# Patient Record
Sex: Female | Born: 1990 | Race: White | Hispanic: No | Marital: Single | Smoking: Former smoker
Health system: Southern US, Community
[De-identification: ages and names within clinical notes are randomized; demographics above are authoritative.]

## PROBLEM LIST (undated history)

## (undated) DIAGNOSIS — Q059 Spina bifida, unspecified: Secondary | ICD-10-CM

## (undated) DIAGNOSIS — Z8669 Personal history of other diseases of the nervous system and sense organs: Secondary | ICD-10-CM

## (undated) HISTORY — DX: Personal history of other diseases of the nervous system and sense organs: Z86.69

## (undated) HISTORY — PX: TYMPANOSTOMY TUBE PLACEMENT: SHX32

## (undated) HISTORY — DX: Spina bifida, unspecified: Q05.9

## (undated) HISTORY — PX: OTHER SURGICAL HISTORY: SHX169

---

## 2019-08-02 ENCOUNTER — Telehealth: Payer: Self-pay | Admitting: Family

## 2019-08-02 DIAGNOSIS — R399 Unspecified symptoms and signs involving the genitourinary system: Secondary | ICD-10-CM

## 2019-08-02 MED ORDER — CEPHALEXIN 500 MG PO CAPS: 500.0000 mg | ORAL_CAPSULE | Freq: Two times a day (BID) | ORAL | 0 refills | Status: DC

## 2019-08-02 NOTE — Progress Notes (Signed)

## 2019-09-27 ENCOUNTER — Other Ambulatory Visit: Payer: Self-pay | Admitting: Family

## 2019-09-27 NOTE — Progress Notes (Signed)
Approximately 5 minutes was spent documenting and reviewing patient's chart.   

## 2020-11-23 NOTE — L&D Delivery Note (Signed)
Obstetrical Delivery Note   Date of Delivery:   10/23/2021 Primary OB:   [facility] Gestational Age/EDD: [redacted]w[redacted]d Reason for Admission: Elective IOL Antepartum complications: Patient with spina bifida occulta. H/o PP hemorrhage  Delivered By:   Cornelia Copa. MD  Delivery Type:   vacuum, low  Delivery Details:   I was called to see patient who was 4cm an hour before and then with AROM progressed to complete. With pushing, she pushed well, but the baby had decels to the 80s with pushing that stayed down to 30-60 seconds before recovering to normal. With pushing, she pushed to +4. On SVE, she was direct OA, pelvis felt adequate and with BPD to +3 when not pushing and EFW 3000gm. I told her I felt vacuum assistance is warranted due to the decels. After d/w her re: risk and benefits, she was amenable to trial. Foley catheter was removed and kiwi applied to the flexion point in between contraction. With next contraction, the kiwi had its suction placed into the green zone and with the next contraction, she easily delivered the child. Anesthesia:    epidural Intrapartum complications: non reassuring fetal hearts with pushing GBS:    Negative Laceration:    none Episiotomy:    none Rectal exam:   deferred Placenta:    Delivered and expressed via active management. Intact: yes. To pathology: no.  Delayed Cord Clamping: yes Estimated Blood Loss:   Baby:    Liveborn female, APGARs 8/9, weight 3060gm  Cornelia Copa. MD Attending Center for [facility])

## 2021-02-27 ENCOUNTER — Ambulatory Visit (INDEPENDENT_AMBULATORY_CARE_PROVIDER_SITE_OTHER): Payer: Medicaid Other | Admitting: *Deleted

## 2021-02-27 ENCOUNTER — Other Ambulatory Visit: Payer: Self-pay

## 2021-02-27 ENCOUNTER — Encounter: Payer: Self-pay | Admitting: Family Medicine

## 2021-02-27 VITALS — BP 94/71 | HR 75 | Ht 60.0 in | Wt 98.1 lb

## 2021-02-27 DIAGNOSIS — Z3201 Encounter for pregnancy test, result positive: Secondary | ICD-10-CM | POA: Diagnosis not present

## 2021-02-27 DIAGNOSIS — Z3200 Encounter for pregnancy test, result unknown: Secondary | ICD-10-CM | POA: Diagnosis not present

## 2021-02-27 LAB — POCT PREGNANCY, URINE: Preg Test, Ur: POSITIVE — AB

## 2021-02-27 NOTE — Addendum Note (Signed)
Addended by: Gerome Apley on: 02/27/2021 01:35 PM   Modules accepted: Level of Service

## 2021-02-27 NOTE — Progress Notes (Signed)
Agree with A & P. 

## 2021-02-27 NOTE — Progress Notes (Signed)
Here today for pregnancy test. States LMP 01/20/21; is sure of that date. This gives her EDD 10/27/21. We discussed we recommend starting prenatal care with provider of her choice. She plans to go to [facility] as it is closest to her house once she gets medicaid. Today needs confirmation of pregnancy for medicaid. She is already taking prenatal vitamin gummies. She is low risk. Dr. Alysia Penna in to meet patient.  Airam Heidecker,RN

## 2021-02-27 NOTE — Patient Instructions (Signed)
Prenatal Care Providers           [facility]  [address] [phone]  [facility]   [address]  [phone]  [facility] [place]       [address] [phone]            [facility] [place]     [address] [phone]          [facility]   [address] [phone]  [facility]  [address] [phone]     [facility]  [address]   [phone]     [facility]  Phone: [phone]  [facility]  Phone: [phone]  [facility] Phone: [phone]  [facility] Phone: [phone]  [facility] Phone: [phone]  [facility] Phone: [phone]  [facility]  Phone: [phone]  

## 2021-03-18 ENCOUNTER — Telehealth: Payer: Self-pay

## 2021-03-18 NOTE — Telephone Encounter (Signed)
Pt is scheduled for a New OB appt on 04/02/21. Pt called wanting to know if it is safe to take  Amoxicillin 875 mg prescribed by her Dentist  for an abscess. Pt made aware that Amoxicillin 875 mg is safe to take in pregnancy. Understanding was voiced. Phoebie Shad l Klint Lezcano, CMA

## 2021-03-27 ENCOUNTER — Encounter: Payer: Self-pay | Admitting: *Deleted

## 2021-04-01 ENCOUNTER — Encounter: Payer: Self-pay | Admitting: Family Medicine

## 2021-04-01 DIAGNOSIS — Z3490 Encounter for supervision of normal pregnancy, unspecified, unspecified trimester: Secondary | ICD-10-CM | POA: Insufficient documentation

## 2021-04-01 NOTE — Progress Notes (Signed)
Mother has Spinocerebellum Ataxia Type DATING AND VIABILITY SONOGRAM   Linda Wilkins is a 30 y.o. year old G2P1001 with LMP Patient's last menstrual period was 01/20/2021. which would correlate to  [redacted]w[redacted]d weeks gestation.  She has regular menstrual cycles.   She is here today for a confirmatory initial sonogram.    GESTATION:[redacted]w[redacted]d SINGLETON Yes     FETAL ACTIVITY:present          Heart rate         175          The fetus is active.        ADNEXA: The ovaries are normal.   GESTATIONAL AGE AND  BIOMETRICS:  Gestational criteria: Estimated Date of Delivery: 10/27/21 by early ultrasound now at [redacted]w[redacted]d  Previous Scans:10            10 weeks  CROWN RUMP LENGTH     3.23cm         10 weeks 1d                                                                               AVERAGE EGA(BY THIS SCAN):10weeks 1d  WORKING EDD 10/27/21     TECHNICIAN COMMENTS:     A copy of this report including all images has been saved and backed up to a second source for retrieval if needed. All measures and details of the anatomical scan, placentation, fluid volume and pelvic anatomy are contained in that report.  Granville Lewis 04/02/2021 3:34 PM

## 2021-04-02 ENCOUNTER — Other Ambulatory Visit: Payer: Self-pay

## 2021-04-02 ENCOUNTER — Other Ambulatory Visit (HOSPITAL_COMMUNITY)
Admission: RE | Admit: 2021-04-02 | Discharge: 2021-04-02 | Disposition: A | Discharge status: Home / Self Care | Payer: Medicaid Other | Source: Ambulatory Visit | Attending: Family Medicine | Admitting: Family Medicine

## 2021-04-02 ENCOUNTER — Ambulatory Visit (INDEPENDENT_AMBULATORY_CARE_PROVIDER_SITE_OTHER): Payer: Medicaid Other | Admitting: Family Medicine

## 2021-04-02 ENCOUNTER — Encounter: Payer: Self-pay | Admitting: Family Medicine

## 2021-04-02 VITALS — BP 90/62 | HR 63 | Wt 108.0 lb

## 2021-04-02 DIAGNOSIS — Z3481 Encounter for supervision of other normal pregnancy, first trimester: Secondary | ICD-10-CM | POA: Diagnosis not present

## 2021-04-02 DIAGNOSIS — Z820 Family history of epilepsy and other diseases of the nervous system: Secondary | ICD-10-CM

## 2021-04-02 DIAGNOSIS — Z8759 Personal history of other complications of pregnancy, childbirth and the puerperium: Secondary | ICD-10-CM | POA: Diagnosis not present

## 2021-04-02 DIAGNOSIS — Z3A10 10 weeks gestation of pregnancy: Secondary | ICD-10-CM

## 2021-04-02 DIAGNOSIS — Z3490 Encounter for supervision of normal pregnancy, unspecified, unspecified trimester: Secondary | ICD-10-CM | POA: Insufficient documentation

## 2021-04-02 NOTE — Progress Notes (Signed)
Subjective:  Linda Wilkins is a G2P1001 [redacted]w[redacted]d being seen today for her first obstetrical visit.  Her obstetrical history is significant for second pregnancy. First pregnancy ended in SVD. did have PPH, needing cytotec and IM injection. No other problems with pregnancy. 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 Patient does not intend to breast feed. Pregnancy history fully reviewed.  Patient reports no complaints.  BP 90/62   Pulse 63   Wt 108 lb (49 kg)   LMP 01/20/2021   BMI 21.09 kg/m   HISTORY: OB History  Gravida Para Term Preterm AB Living  2 1 1     1   SAB IAB Ectopic Multiple Live Births          1    # Outcome Date GA Lbr Len/2nd Weight Sex Delivery Anes PTL Lv  2 Current           1 Term 10/15/13     VBAC   LIV    Past Medical History:  Diagnosis Date  . Hx of migraines   . Spina bifida (HCC)   . Spina bifida [facility])     Past Surgical History:  Procedure Laterality Date  . adnoids    . TYMPANOSTOMY TUBE PLACEMENT      Family History  Problem Relation Age of Onset  . Rheum arthritis Mother   . Lumbar disc disease Mother   . Hypothyroidism Mother      Exam  BP 90/62   Pulse 63   Wt 108 lb (49 kg)   LMP 01/20/2021   BMI 21.09 kg/m   Chaperone present during exam  CONSTITUTIONAL: Well-developed, well-nourished female in no acute distress.  HENT:  Normocephalic, atraumatic, External right and left ear normal. Oropharynx is clear and moist EYES: Conjunctivae and EOM are normal. Pupils are equal, round, and reactive to light. No scleral icterus.  NECK: Normal range of motion, supple, no masses.  Normal thyroid.  CARDIOVASCULAR: Normal heart rate noted, regular rhythm RESPIRATORY: Clear to auscultation bilaterally. Effort and breath sounds normal, no problems with respiration noted. BREASTS: Symmetric in size. No masses, skin changes, nipple drainage, or lymphadenopathy. ABDOMEN: Soft, normal bowel sounds, no distention noted.  No tenderness, rebound  or guarding.  PELVIC: Normal appearing external genitalia; normal appearing vaginal mucosa and cervix. No abnormal discharge noted. Normal uterine size, no other palpable masses, no uterine or adnexal tenderness. MUSCULOSKELETAL: Normal range of motion. No tenderness.  No cyanosis, clubbing, or edema.  2+ distal pulses. SKIN: Skin is warm and dry. No rash noted. Not diaphoretic. No erythema. No pallor. NEUROLOGIC: Alert and oriented to person, place, and time. Normal reflexes, muscle tone coordination. No cranial nerve deficit noted. PSYCHIATRIC: Normal mood and affect. Normal behavior. Normal judgment and thought content.    Assessment:    Pregnancy: G2P1001 Patient Active Problem List   Diagnosis Date Noted  . Family history of spinocerebellar ataxia 04/02/2021  . Supervision of normal pregnancy 04/01/2021      Plan:   1. [redacted] weeks gestation of pregnancy - 06/01/2021 MFM OB DETAIL +14 WK; Future - Culture, OB Urine - Genetic Screening  2. Encounter for supervision of normal pregnancy, antepartum, unspecified gravidity Initial labs obtained Continue prenatal vitamins Reviewed n/v relief measures and warning s/s to report Reviewed recommended weight gain based on pre-gravid BMI Encouraged well-balanced diet Genetic & carrier screening discussed: requests Panorama,  Ultrasound discussed; fetal survey: requested CCNC completed> form faxed if has or is planning to apply for medicaid The nature of  [facility] with multiple MDs and other Advanced Practice Providers was explained to patient; also emphasized that fellows, residents, and students are part of our team. - Obstetric Panel, Including HIV - Hepatitis C Antibody - AMB MFM GENETICS REFERRAL - Cytology - PAP( [place]) - Culture, OB Urine - Genetic Screening  3. Family history of spinocerebellar ataxia Refer to genetics. - AMB MFM GENETICS REFERRAL - [place] MFM OB DETAIL +14 WK; Future - Culture, OB  Urine - Genetic Screening  4. History of postpartum hemorrhage Aggressive management of third stage    Problem list reviewed and updated. 75% of 30 min visit spent on counseling and coordination of care.     Levie Heritage 04/02/2021

## 2021-04-02 NOTE — Progress Notes (Signed)
Bedside U/S shows single IUP with FHT of 175 BPM and CRL measures 3.23cm  GA [redacted]w[redacted]d which is consistent with LMP

## 2021-04-04 LAB — URINE CULTURE, OB REFLEX

## 2021-04-04 LAB — CYTOLOGY - PAP
Chlamydia: NEGATIVE
Comment: NEGATIVE
Comment: NORMAL
Diagnosis: NEGATIVE
Neisseria Gonorrhea: NEGATIVE

## 2021-04-04 LAB — CULTURE, OB URINE

## 2021-04-07 ENCOUNTER — Telehealth: Payer: Self-pay

## 2021-04-07 LAB — OBSTETRIC PANEL, INCLUDING HIV
Antibody Screen: NEGATIVE
Basophils Absolute: 0 10*3/uL (ref 0.0–0.2)
Basos: 1 %
EOS (ABSOLUTE): 0.3 10*3/uL (ref 0.0–0.4)
Eos: 4 %
HIV Screen 4th Generation wRfx: NONREACTIVE
Hematocrit: 36.7 % (ref 34.0–46.6)
Hemoglobin: 12.6 g/dL (ref 11.1–15.9)
Hepatitis B Surface Ag: NEGATIVE
Immature Grans (Abs): 0 10*3/uL (ref 0.0–0.1)
Immature Granulocytes: 0 %
Lymphocytes Absolute: 1.4 10*3/uL (ref 0.7–3.1)
Lymphs: 18 %
MCH: 31.7 pg (ref 26.6–33.0)
MCHC: 34.3 g/dL (ref 31.5–35.7)
MCV: 92 fL (ref 79–97)
Monocytes Absolute: 0.5 10*3/uL (ref 0.1–0.9)
Monocytes: 6 %
Neutrophils Absolute: 5.5 10*3/uL (ref 1.4–7.0)
Neutrophils: 71 %
Platelets: 194 10*3/uL (ref 150–450)
RBC: 3.98 x10E6/uL (ref 3.77–5.28)
RDW: 12.2 % (ref 11.7–15.4)
RPR Ser Ql: NONREACTIVE
Rh Factor: POSITIVE
Rubella Antibodies, IGG: 23.5 index (ref >0.99)
WBC: 7.8 10*3/uL (ref 3.4–10.8)

## 2021-04-07 LAB — HEPATITIS C ANTIBODY: Hep C Virus Ab: <0.1 s/co ratio (ref 0.0–0.9)

## 2021-04-07 NOTE — Telephone Encounter (Signed)
Talked to Bill at [facility] regarding missing ICD 10 code. Bill made aware that her ICD 10 code is Z34.90.  Amayra Kiedrowski l Shamyah Stantz, CMA

## 2021-04-08 ENCOUNTER — Encounter: Payer: Self-pay | Admitting: General Practice

## 2021-04-09 ENCOUNTER — Encounter: Payer: Self-pay | Admitting: General Practice

## 2021-04-12 ENCOUNTER — Inpatient Hospital Stay (HOSPITAL_COMMUNITY): Payer: Medicaid Other

## 2021-04-12 ENCOUNTER — Other Ambulatory Visit: Payer: Self-pay

## 2021-04-12 ENCOUNTER — Inpatient Hospital Stay (HOSPITAL_COMMUNITY)
Admission: EM | Admit: 2021-04-12 | Discharge: 2021-04-12 | Disposition: A | Discharge status: Home / Self Care | Payer: Medicaid Other | Attending: Obstetrics and Gynecology | Admitting: Obstetrics and Gynecology

## 2021-04-12 ENCOUNTER — Encounter (HOSPITAL_COMMUNITY): Payer: Self-pay | Admitting: *Deleted

## 2021-04-12 DIAGNOSIS — Z87891 Personal history of nicotine dependence: Secondary | ICD-10-CM | POA: Insufficient documentation

## 2021-04-12 DIAGNOSIS — O208 Other hemorrhage in early pregnancy: Secondary | ICD-10-CM | POA: Diagnosis not present

## 2021-04-12 DIAGNOSIS — Z3A12 12 weeks gestation of pregnancy: Secondary | ICD-10-CM | POA: Diagnosis not present

## 2021-04-12 DIAGNOSIS — O468X1 Other antepartum hemorrhage, first trimester: Secondary | ICD-10-CM

## 2021-04-12 DIAGNOSIS — O209 Hemorrhage in early pregnancy, unspecified: Secondary | ICD-10-CM

## 2021-04-12 DIAGNOSIS — Z3A11 11 weeks gestation of pregnancy: Secondary | ICD-10-CM

## 2021-04-12 DIAGNOSIS — Z3490 Encounter for supervision of normal pregnancy, unspecified, unspecified trimester: Secondary | ICD-10-CM

## 2021-04-12 DIAGNOSIS — O418X10 Other specified disorders of amniotic fluid and membranes, first trimester, not applicable or unspecified: Secondary | ICD-10-CM

## 2021-04-12 DIAGNOSIS — O418X90 Other specified disorders of amniotic fluid and membranes, unspecified trimester, not applicable or unspecified: Secondary | ICD-10-CM | POA: Insufficient documentation

## 2021-04-12 DIAGNOSIS — O4690 Antepartum hemorrhage, unspecified, unspecified trimester: Secondary | ICD-10-CM

## 2021-04-12 LAB — URINALYSIS, ROUTINE W REFLEX MICROSCOPIC
Bacteria, UA: NONE SEEN
Bilirubin Urine: NEGATIVE
Glucose, UA: NEGATIVE mg/dL
Ketones, ur: NEGATIVE mg/dL
Leukocytes,Ua: NEGATIVE
Nitrite: NEGATIVE
Protein, ur: NEGATIVE mg/dL
Specific Gravity, Urine: 1.015 (ref 1.005–1.030)
pH: 6 (ref 5.0–8.0)

## 2021-04-12 NOTE — Discharge Instructions (Signed)
Return to MAU:  If you have heavy bleeding that soaks through more that 2 pads per hour for an hour or more  If you bleed so much that you feel like you might pass out or you do pass out  If you have significant abdominal pain that is not improved with Tylenol 1000 mg every 6 hours as needed for pain  If you develop a fever > 100.5   

## 2021-04-12 NOTE — ED Provider Notes (Signed)
Emergency Medicine Provider OB Triage Evaluation Note  Shan Padgett is a 30 y.o. female, G2P1001, at [redacted]w[redacted]d gestation who presents to the emergency department with complaints of vaginal bleeding that began this AM. No abd pain, urinary complaints.   Review of  Systems  Positive: vaginal bleeding Negative: abd pain  Physical Exam  BP 111/71 (BP Location: Right Arm)   Pulse 88   Temp 98.5 F (36.9 C)   Resp 16   Ht 5' (1.524 m)   Wt 49.9 kg   LMP 01/20/2021   SpO2 100%   BMI 21.48 kg/m  General: Awake, no distress  HEENT: Atraumatic  Resp: Normal effort  Cardiac: Normal rate Abd: Nondistended, nontender  MSK: Moves all extremities without difficulty Neuro: Speech clear  Medical Decision Making  Pt evaluated for pregnancy concern and is stable for transfer to MAU. Pt is in agreement with plan for transfer.  10:32 AM Discussed with MAU APP, Joni Reining , who accepts patient in transfer.  Clinical Impression   1. Vaginal bleeding in pregnancy        Rosana Hoes 04/12/21 1032    Terald Sleeper, MD 04/12/21 [phone]

## 2021-04-12 NOTE — ED Triage Notes (Addendum)
Pt is [redacted] weeks pregnant.  She noted blood in her underwear today.  No clots or cramping.  Pt is worried b/c her dog jumped on her stomach last night.

## 2021-04-12 NOTE — MAU Provider Note (Signed)
History     CSN: [id number]  Arrival date and time: 04/12/21 1014   Event Date/Time   First Provider Initiated Contact with Patient 04/12/21 1206      Chief Complaint  Patient presents with  . Vaginal Bleeding    [redacted] weeks pregnant   Linda Wilkins is a 30 y.o. year old G57P1001 female at [redacted]w[redacted]d weeks gestation who presents to MAU reporting vaginal bleeding in the toilet while at home at 1045 this morning. She reports she had SI last night. She denies any pain. She also is worried that since her 70 lb dog jumped on her stomach that that could have caused this to happen. She receives her [facility] with [facility]; next appt is on 04/30/2021.   OB History    Gravida  2   Para  1   Term  1   Preterm      AB      Living  1     SAB      IAB      Ectopic      Multiple      Live Births  1           Past Medical History:  Diagnosis Date  . Hx of migraines   . Spina bifida (HCC)   . Spina bifida [facility])     Past Surgical History:  Procedure Laterality Date  . adnoids    . TYMPANOSTOMY TUBE PLACEMENT      Family History  Problem Relation Age of Onset  . Rheum arthritis Mother   . Lumbar disc disease Mother   . Hypothyroidism Mother     Social History   Tobacco Use  . Smoking status: Former Smoker    Types: Cigarettes  . Smokeless tobacco: Never Used  Vaping Use  . Vaping Use: Former  . Substances: Nicotine, Flavoring  Substance Use Topics  . Alcohol use: Not Currently  . Drug use: Not Currently    Allergies:  Allergies  Allergen Reactions  . Bactrim [Sulfamethoxazole-Trimethoprim]     Medications Prior to Admission  Medication Sig Dispense Refill Last Dose  . Prenatal MV & Min w/FA-DHA (PRENATAL GUMMIES PO) Take 2 tablets by mouth daily.   04/11/2021 at 0800    Review of Systems  Constitutional: Negative.   HENT: Negative.   Eyes: Negative.   Respiratory: Negative.   Cardiovascular: Negative.   Gastrointestinal: Negative.   Endocrine:  Negative.   Genitourinary: Positive for vaginal bleeding. Negative for pelvic pain.  Musculoskeletal: Negative.   Skin: Negative.   Allergic/Immunologic: Negative.   Neurological: Negative.   Hematological: Negative.   Psychiatric/Behavioral: Negative.    Physical Exam   Blood pressure 90/62, pulse 71, temperature 97.7 F (36.5 C), temperature source Oral, resp. rate 20, height 5' (1.524 m), weight 49.2 kg, last menstrual period 01/20/2021, SpO2 100 %.  Physical Exam Vitals and nursing note reviewed. Exam conducted with a chaperone present.  Constitutional:      Appearance: Normal appearance. She is normal weight.  Cardiovascular:     Rate and Rhythm: Normal rate.     Pulses: Normal pulses.  Pulmonary:     Effort: Pulmonary effort is normal.  Abdominal:     General: Abdomen is flat.     Palpations: Abdomen is soft.  Genitourinary:    General: Normal vulva.     Comments: Pelvic exam: External genitalia normal, SE: vaginal walls pink and well rugated, cervix is smooth, pink, no lesions, small amt of dark,  reddish brown blood in vaginal vault -- cervix visually closed, Uterus is non-tender, S=D, no CMT or friability, no adnexal tenderness.  Musculoskeletal:     Cervical back: Normal range of motion.  Skin:    General: Skin is warm and dry.  Neurological:     Mental Status: She is alert and oriented to person, place, and time.  Psychiatric:        Mood and Affect: Mood normal.        Behavior: Behavior normal.        Thought Content: Thought content normal.        Judgment: Judgment normal.    MAU Course  Procedures  MDM CCUA Speculum exam Cervical exam OB U/S <14 wks  Results for orders placed or performed during the hospital encounter of 04/12/21 (from the past 24 hour(s))  Urinalysis, Routine w reflex microscopic Urine, Clean Catch     Status: Abnormal   Collection Time: 04/12/21 11:24 AM  Result Value Ref Range   Color, Urine YELLOW YELLOW   APPearance CLEAR  CLEAR   Specific Gravity, Urine 1.015 1.005 - 1.030   pH 6.0 5.0 - 8.0   Glucose, UA NEGATIVE NEGATIVE mg/dL   Hgb urine dipstick SMALL (A) NEGATIVE   Bilirubin Urine NEGATIVE NEGATIVE   Ketones, ur NEGATIVE NEGATIVE mg/dL   Protein, ur NEGATIVE NEGATIVE mg/dL   Nitrite NEGATIVE NEGATIVE   Leukocytes,Ua NEGATIVE NEGATIVE   WBC, UA 0-5 0 - 5 WBC/hpf   Bacteria, UA NONE SEEN NONE SEEN   Squamous Epithelial / LPF 0-5 0 - 5   Mucus PRESENT     US OB Comp Less 14 Wks  Result Date: 04/12/2021 CLINICAL DATA:  Bleeding for 1 week. 30 year old female with gestational age of [redacted] weeks 5 days by last menstrual. Presents with vaginal bleeding in the setting of pregnancy. EXAM: OBSTETRIC <14 WK ULTRASOUND TECHNIQUE: Transabdominal ultrasound was performed for evaluation of the gestation as well as the maternal uterus and adnexal regions. COMPARISON:  None FINDINGS: Intrauterine gestational sac: Single Yolk sac:  Visualized Embryo:  Visualized Cardiac Activity: Visualized. Heart Rate: 162 bpm CRL: 51.2 mm   11 w 6 d                  [place] EDC: 10/26/2021 Subchorionic hemorrhage:  Small subchorionic hemorrhage Maternal uterus/adnexae: Normal appearance of the bilateral ovaries. Gestational sac in the fundus. IMPRESSION: Single viable intrauterine pregnancy, 11 weeks 6 days by crown-rump length with a fetal heart rate of 162 beats per minute. Small subchorionic hemorrhage. Electronically Signed   By: Donzetta Kohut M.D.   On: 04/12/2021 14:07    Assessment and Plan  Vaginal bleeding in pregnancy - Return to MAU:  If you have heavier bleeding that soaks through more that 2 pads per hour for an hour or more  If you bleed so much that you feel like you might pass out or you do pass out  If you have significant abdominal pain that is not improved with Tylenol 1000 mg every 6 hours as needed for pain  If you develop a fever > 100.5  Subchorionic hematoma in first trimester, single or unspecified fetus -  Information provided on [facility], threatened miscarriage & activity restrictions in pregnancy   [redacted] weeks gestation of pregnancy   - Discharge patient - Keep scheduled appt with [facility] on 04/30/2021 - Patient verbalized an understanding of the plan of care and agrees.     Raelyn Mora, CNM 04/12/2021, 12:18 PM

## 2021-04-12 NOTE — MAU Note (Signed)
Presents with c/o VB that began @ 1045 this morning.  Reports VB was noted in toilet @ home, but upon using restroom @ hospital no VB noted.  Reports last intercourse during the night.

## 2021-04-24 ENCOUNTER — Encounter: Payer: Self-pay | Admitting: General Practice

## 2021-04-30 ENCOUNTER — Ambulatory Visit (INDEPENDENT_AMBULATORY_CARE_PROVIDER_SITE_OTHER): Payer: Medicaid Other | Admitting: Family Medicine

## 2021-04-30 ENCOUNTER — Other Ambulatory Visit: Payer: Self-pay

## 2021-04-30 VITALS — BP 95/60 | HR 72 | Temp 97.8°F | Wt 115.0 lb

## 2021-04-30 DIAGNOSIS — O418X10 Other specified disorders of amniotic fluid and membranes, first trimester, not applicable or unspecified: Secondary | ICD-10-CM

## 2021-04-30 DIAGNOSIS — Z3A14 14 weeks gestation of pregnancy: Secondary | ICD-10-CM

## 2021-04-30 DIAGNOSIS — Z3490 Encounter for supervision of normal pregnancy, unspecified, unspecified trimester: Secondary | ICD-10-CM

## 2021-04-30 DIAGNOSIS — Z8759 Personal history of other complications of pregnancy, childbirth and the puerperium: Secondary | ICD-10-CM

## 2021-04-30 DIAGNOSIS — Z820 Family history of epilepsy and other diseases of the nervous system: Secondary | ICD-10-CM

## 2021-04-30 DIAGNOSIS — O468X1 Other antepartum hemorrhage, first trimester: Secondary | ICD-10-CM

## 2021-04-30 NOTE — Progress Notes (Signed)
   PRENATAL VISIT NOTE  Subjective:  Cristen Bredeson is a 30 y.o. G2P1001 at [redacted]w[redacted]d being seen today for ongoing prenatal care.  She is currently monitored for the following issues for this low-risk pregnancy and has Supervision of normal pregnancy; Family history of spinocerebellar ataxia; and History of postpartum hemorrhage on their problem list.  Patient reports no complaints. No further bleeding (was seen in MAU for subchorionic hemorrhage).  Contractions: Not present. Vag. Bleeding: None.  Movement: Absent. Denies leaking of fluid.   The following portions of the patient's history were reviewed and updated as appropriate: allergies, current medications, past family history, past medical history, past social history, past surgical history and problem list.   Objective:   Vitals:   04/30/21 1359  BP: 95/60  Pulse: 72  Temp: 97.8 F (36.6 C)  Weight: 115 lb (52.2 kg)    Fetal Status: Fetal Heart Rate (bpm): 157   Movement: Absent     General:  Alert, oriented and cooperative. Patient is in no acute distress.  Skin: Skin is warm and dry. No rash noted.   Cardiovascular: Normal heart rate noted  Respiratory: Normal respiratory effort, no problems with respiration noted  Abdomen: Soft, gravid, appropriate for gestational age.  Pain/Pressure: Absent     Pelvic: Cervical exam deferred        Extremities: Normal range of motion.  Edema: None  Mental Status: Normal mood and affect. Normal behavior. Normal judgment and thought content.   Assessment and Plan:  Pregnancy: G2P1001 at [redacted]w[redacted]d  1. [redacted] weeks gestation of pregnancy  2. Encounter for supervision of normal pregnancy, antepartum, unspecified gravidity FHT and FH normal  3. History of postpartum hemorrhage  4. Family history of spinocerebellar ataxia  5. Subchorionic hematoma in first trimester, single or unspecified fetus resolved   Preterm labor symptoms and general obstetric precautions including but not limited to  vaginal bleeding, contractions, leaking of fluid and fetal movement were reviewed in detail with the patient. Please refer to After Visit Summary for other counseling recommendations.   Return in about 4 weeks (around 05/28/2021) for OB f/u, In Office.  Future Appointments  Date Time Provider Department Center  05/30/2021  8:45 AM Levie Heritage, DO [facility] None  06/04/2021  8:00 AM [facility] NURSE [facility]  06/04/2021  8:15 AM [facility] US2 [facility]  06/04/2021  9:00 AM [facility] GENETIC COUNSELING RM [facility]    Levie Heritage, DO

## 2021-05-06 ENCOUNTER — Telehealth: Payer: Self-pay

## 2021-05-06 NOTE — Telephone Encounter (Signed)
mar/lm for patient requesting a c/b  (to reschedule Gen Couns for 06/04/2021 to a tues or thursday.

## 2021-05-14 DIAGNOSIS — U071 COVID-19: Secondary | ICD-10-CM

## 2021-05-15 MED ORDER — PAXLOVID 20 X 150 MG & 10 X 100MG PO TBPK: 1.0000 | ORAL_TABLET | ORAL | 0 refills | Status: DC

## 2021-05-30 ENCOUNTER — Other Ambulatory Visit: Payer: Self-pay

## 2021-05-30 ENCOUNTER — Ambulatory Visit (INDEPENDENT_AMBULATORY_CARE_PROVIDER_SITE_OTHER): Payer: Medicaid Other | Admitting: Family Medicine

## 2021-05-30 ENCOUNTER — Encounter: Payer: Self-pay | Admitting: Family Medicine

## 2021-05-30 VITALS — BP 92/58 | HR 83 | Wt 122.0 lb

## 2021-05-30 DIAGNOSIS — Z820 Family history of epilepsy and other diseases of the nervous system: Secondary | ICD-10-CM

## 2021-05-30 DIAGNOSIS — Z8759 Personal history of other complications of pregnancy, childbirth and the puerperium: Secondary | ICD-10-CM

## 2021-05-30 DIAGNOSIS — Z3482 Encounter for supervision of other normal pregnancy, second trimester: Secondary | ICD-10-CM

## 2021-05-30 NOTE — Progress Notes (Signed)
   PRENATAL VISIT NOTE  Subjective:  Linda Wilkins is a 30 y.o. G2P1001 at [redacted]w[redacted]d being seen today for ongoing prenatal care.  She is currently monitored for the following issues for this low-risk pregnancy and has Supervision of normal pregnancy; Family history of spinocerebellar ataxia; and History of postpartum hemorrhage on their problem list.  Patient reports no complaints.  Contractions: Not present. Vag. Bleeding: None.  Movement: Present. Denies leaking of fluid.   The following portions of the patient's history were reviewed and updated as appropriate: allergies, current medications, past family history, past medical history, past social history, past surgical history and problem list.   Objective:   Vitals:   05/30/21 0837  BP: (!) 92/58  Pulse: 83  Weight: 122 lb (55.3 kg)    Fetal Status: Fetal Heart Rate (bpm): 145   Movement: Present     General:  Alert, oriented and cooperative. Patient is in no acute distress.  Skin: Skin is warm and dry. No rash noted.   Cardiovascular: Normal heart rate noted  Respiratory: Normal respiratory effort, no problems with respiration noted  Abdomen: Soft, gravid, appropriate for gestational age.  Pain/Pressure: Absent     Pelvic: Cervical exam deferred        Extremities: Normal range of motion.  Edema: None  Mental Status: Normal mood and affect. Normal behavior. Normal judgment and thought content.   Assessment and Plan:  Pregnancy: G2P1001 at [redacted]w[redacted]d 1. Encounter for supervision of other normal pregnancy in second trimester FHT and FH normal - AFP, Serum, Open Spina Bifida  2. History of postpartum hemorrhage Active management of 3rd stage of labor  3. Family history of spinocerebellar ataxia Referred to genetics  Preterm labor symptoms and general obstetric precautions including but not limited to vaginal bleeding, contractions, leaking of fluid and fetal movement were reviewed in detail with the patient. Please refer to  After Visit Summary for other counseling recommendations.   Return in about 4 weeks (around 06/27/2021) for OB f/u.  Future Appointments  Date Time Provider Department Center  06/04/2021  8:00 AM [facility] NURSE [facility]  06/04/2021  8:15 AM [facility] US2 [facility]  06/10/2021  9:00 AM [facility] GENETIC COUNSELING RM [facility]  06/26/2021  8:35 AM Conan Bowens, MD [facility] None  07/24/2021  8:15 AM Levie Heritage, DO [facility] None    Levie Heritage, DO

## 2021-05-30 NOTE — Progress Notes (Signed)
AFP today

## 2021-06-01 LAB — AFP, SERUM, OPEN SPINA BIFIDA
AFP MoM: 0.74
AFP Value: 42.1 ng/mL
Gest. Age on Collection Date: 18.6 weeks
Maternal Age At EDD: 29 yr
OSBR Risk 1 IN: 10000
Test Results:: NEGATIVE
Weight: 122 [lb_av]

## 2021-06-04 ENCOUNTER — Other Ambulatory Visit: Payer: Self-pay

## 2021-06-04 ENCOUNTER — Ambulatory Visit: Payer: Medicaid Other | Admitting: *Deleted

## 2021-06-04 ENCOUNTER — Encounter: Payer: Self-pay | Admitting: *Deleted

## 2021-06-04 ENCOUNTER — Ambulatory Visit: Payer: Medicaid Other | Attending: Family Medicine

## 2021-06-04 VITALS — BP 99/53 | HR 69

## 2021-06-04 DIAGNOSIS — Z363 Encounter for antenatal screening for malformations: Secondary | ICD-10-CM | POA: Diagnosis not present

## 2021-06-04 DIAGNOSIS — F17210 Nicotine dependence, cigarettes, uncomplicated: Secondary | ICD-10-CM

## 2021-06-04 DIAGNOSIS — O99332 Smoking (tobacco) complicating pregnancy, second trimester: Secondary | ICD-10-CM

## 2021-06-04 DIAGNOSIS — U071 COVID-19: Secondary | ICD-10-CM

## 2021-06-04 DIAGNOSIS — Z820 Family history of epilepsy and other diseases of the nervous system: Secondary | ICD-10-CM

## 2021-06-04 DIAGNOSIS — Z87798 Personal history of other (corrected) congenital malformations: Secondary | ICD-10-CM | POA: Insufficient documentation

## 2021-06-04 DIAGNOSIS — Z3A10 10 weeks gestation of pregnancy: Secondary | ICD-10-CM

## 2021-06-04 DIAGNOSIS — Z3482 Encounter for supervision of other normal pregnancy, second trimester: Secondary | ICD-10-CM

## 2021-06-04 DIAGNOSIS — Z3A19 19 weeks gestation of pregnancy: Secondary | ICD-10-CM

## 2021-06-10 ENCOUNTER — Ambulatory Visit: Payer: Medicaid Other | Attending: Family Medicine

## 2021-06-25 ENCOUNTER — Telehealth: Payer: Self-pay | Admitting: Obstetrics and Gynecology

## 2021-06-25 NOTE — Telephone Encounter (Signed)
Linda Wilkins did not show for genetic counseling visit this morning (and again 2 weeks ago) which was scheduled due to a family history of Spinocerebellar Ataxia type 1 in her mother and several maternal relatives.  She declines to have genetic counseling at this time and is aware that presymptomatic testing is available and that prenatal diagnosis for adult onset disorders is not commonly encouraged.    She declines genetic counseling at this time and will contact us in the future if she wants further information. We can be reached at [phone].  Cherly Anderson, MS, CGC

## 2021-06-26 ENCOUNTER — Ambulatory Visit (INDEPENDENT_AMBULATORY_CARE_PROVIDER_SITE_OTHER): Payer: Medicaid Other | Admitting: Obstetrics and Gynecology

## 2021-06-26 ENCOUNTER — Encounter: Payer: Self-pay | Admitting: Obstetrics and Gynecology

## 2021-06-26 ENCOUNTER — Other Ambulatory Visit: Payer: Self-pay

## 2021-06-26 VITALS — BP 89/65 | HR 97 | Wt 126.0 lb

## 2021-06-26 DIAGNOSIS — Z3A22 22 weeks gestation of pregnancy: Secondary | ICD-10-CM

## 2021-06-26 DIAGNOSIS — Z3482 Encounter for supervision of other normal pregnancy, second trimester: Secondary | ICD-10-CM

## 2021-06-26 NOTE — Progress Notes (Signed)
   PRENATAL VISIT NOTE  Subjective:  Linda Wilkins is a 30 y.o. G2P1001 at [redacted]w[redacted]d being seen today for ongoing prenatal care.  She is currently monitored for the following issues for this low-risk pregnancy and has Supervision of normal pregnancy; Family history of spinocerebellar ataxia; and History of postpartum hemorrhage on their problem list.  Patient reports no complaints.  Contractions: Not present. Vag. Bleeding: None.  Movement: Present. Denies leaking of fluid.   The following portions of the patient's history were reviewed and updated as appropriate: allergies, current medications, past family history, past medical history, past social history, past surgical history and problem list.   Objective:   Vitals:   06/26/21 0842  BP: (!) 89/65  Pulse: 97  Weight: 126 lb 0.6 oz (57.2 kg)    Fetal Status: Fetal Heart Rate (bpm): 153   Movement: Present     General:  Alert, oriented and cooperative. Patient is in no acute distress.  Skin: Skin is warm and dry. No rash noted.   Cardiovascular: Normal heart rate noted  Respiratory: Normal respiratory effort, no problems with respiration noted  Abdomen: Soft, gravid, appropriate for gestational age.  Pain/Pressure: Absent     Pelvic: Cervical exam deferred        Extremities: Normal range of motion.  Edema: Trace  Mental Status: Normal mood and affect. Normal behavior. Normal judgment and thought content.   Assessment and Plan:  Pregnancy: G2P1001 at [redacted]w[redacted]d 1. [redacted] weeks gestation of pregnancy  2. Encounter for supervision of other normal pregnancy in second trimester Interested in water birth, will make next appt with CNM  Preterm labor symptoms and general obstetric precautions including but not limited to vaginal bleeding, contractions, leaking of fluid and fetal movement were reviewed in detail with the patient. Please refer to After Visit Summary for other counseling recommendations.   Return in about 4 weeks (around  07/24/2021) for low OB, in person, 2 hr GTT, 3rd trim labs.  Future Appointments  Date Time Provider Department Center  07/24/2021  8:15 AM Levie Heritage, DO [facility] None    Conan Bowens, MD

## 2021-07-04 IMAGING — US US OB COMP LESS 14 WK
1 series · 15 of 27 positions shown · non-contrast
Comparison: None

CLINICAL DATA: Bleeding for 1 week. 29-year-old female with
gestational age of 11 weeks 5 days by last menstrual. Presents with
vaginal bleeding in the setting of pregnancy.

EXAM:
OBSTETRIC <14 WK ULTRASOUND
TECHNIQUE: Transabdominal ultrasound was performed for evaluation of the
gestation as well as the maternal uterus and adnexal regions.

[Series 1: us ob comp less 14 wk · 15 of 27 slices shown]
[im 1/27]
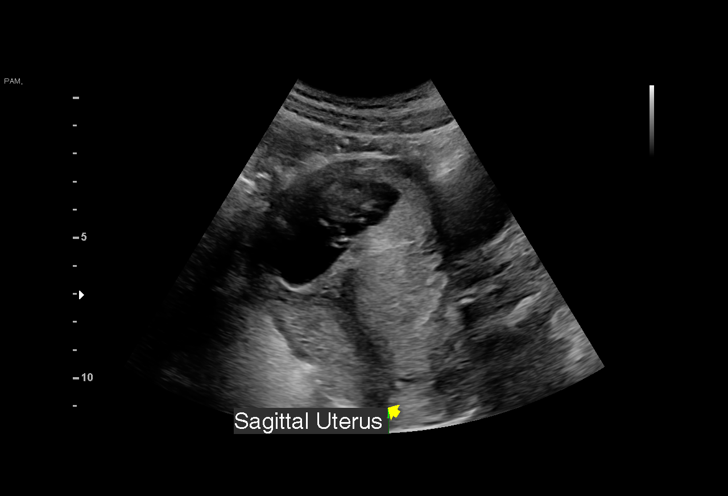
[im 3/27]
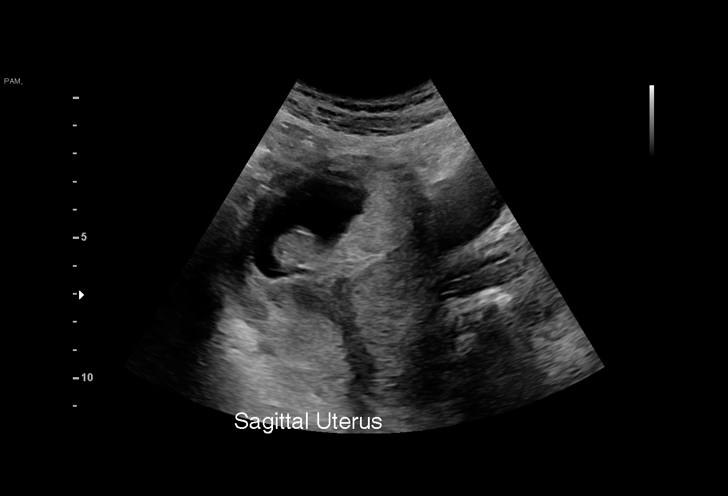
[im 5/27]
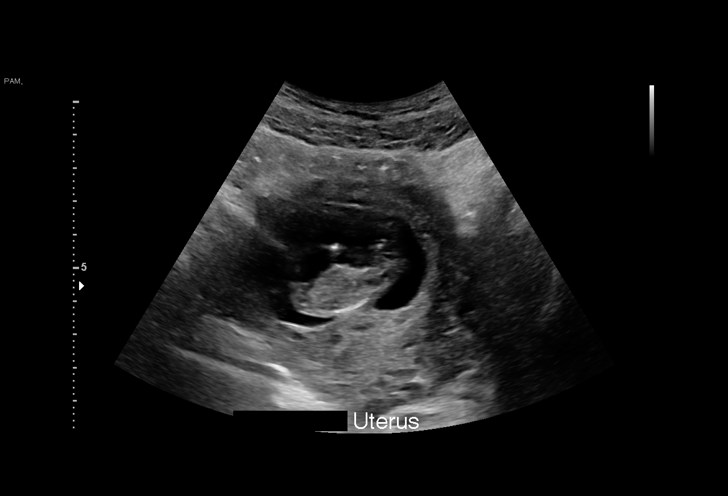
[im 7/27]
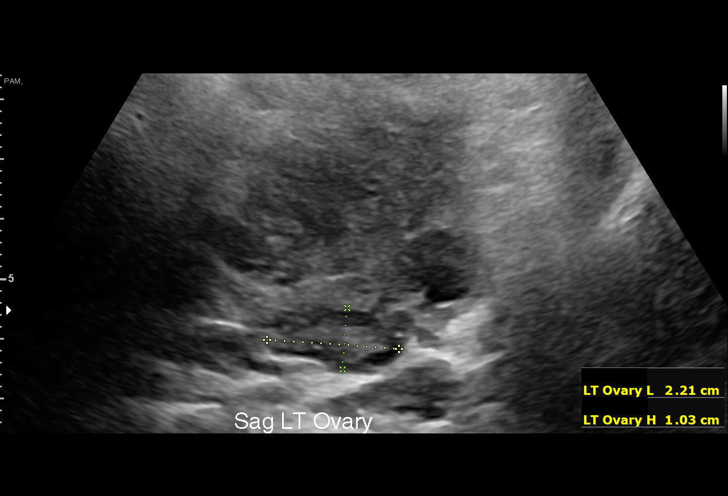
[im 9/27]
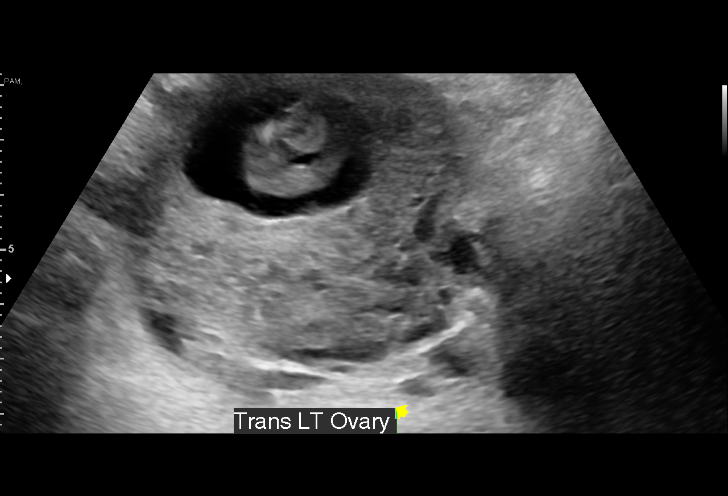
[im 10/27]
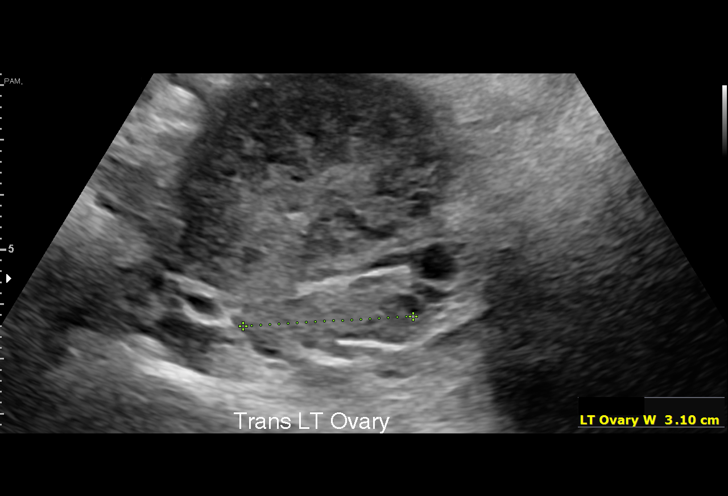
[im 12/27]
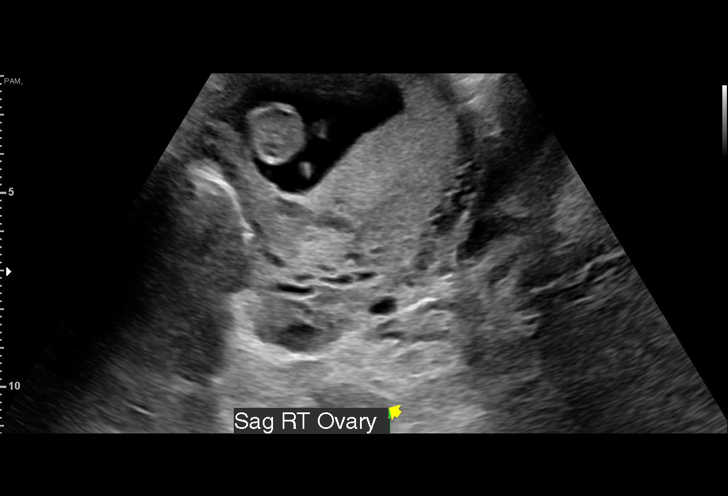
[im 14/27]
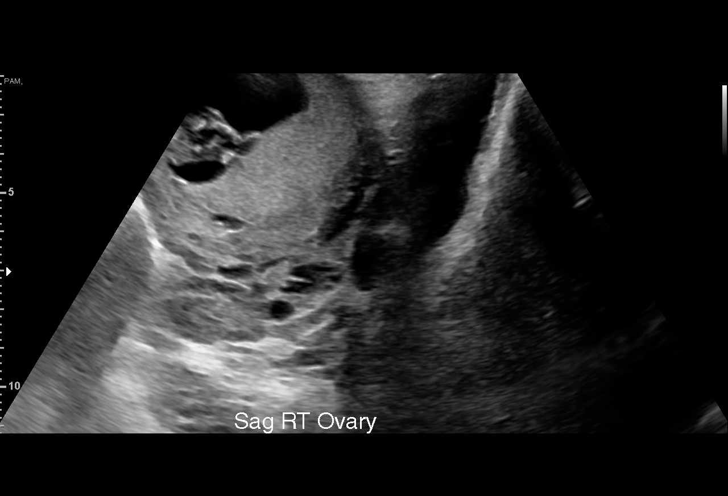
[im 16/27]
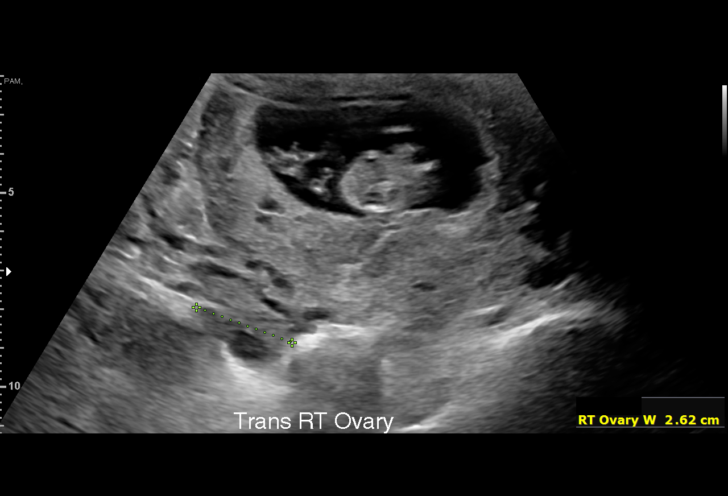
[im 18/27]
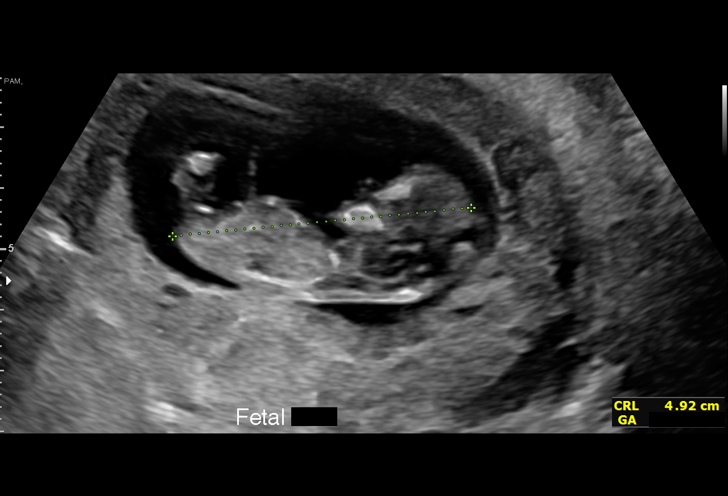
[im 19/27]
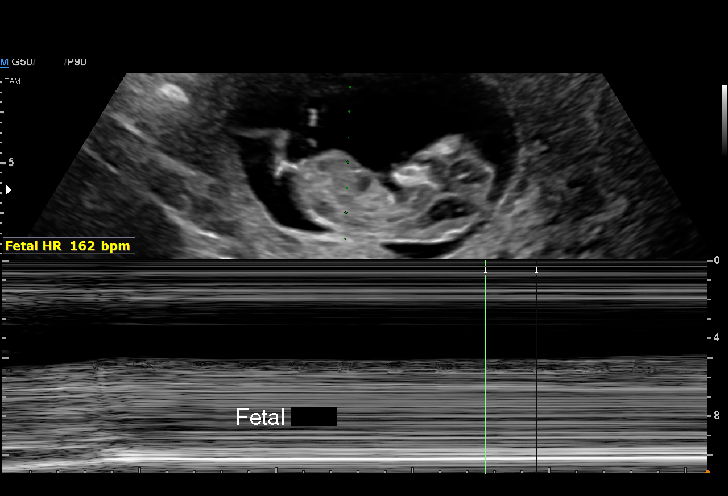
[im 21/27]
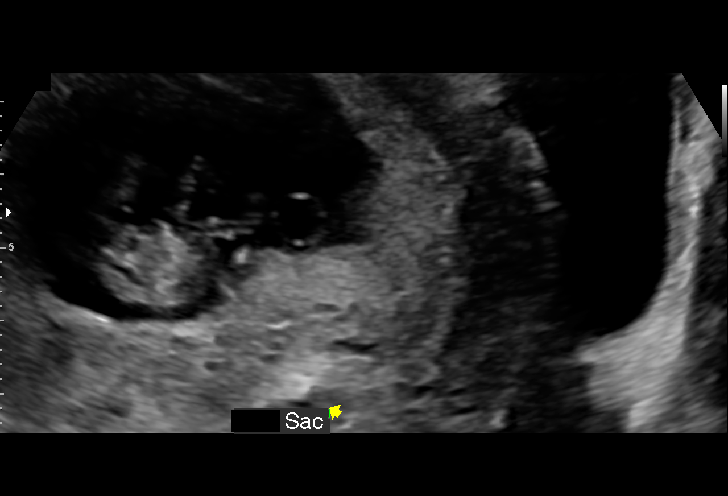
[im 23/27]
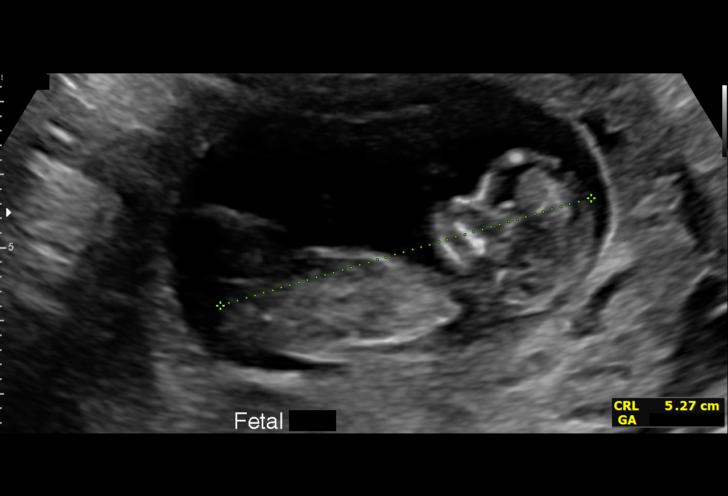
[im 25/27]
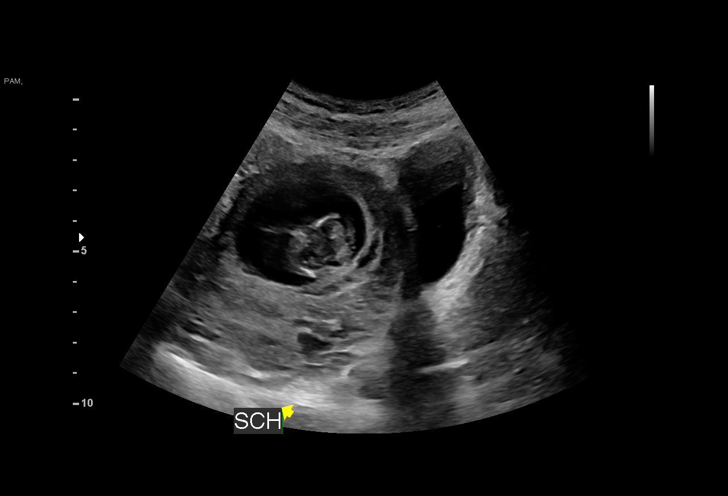
[im 27/27]
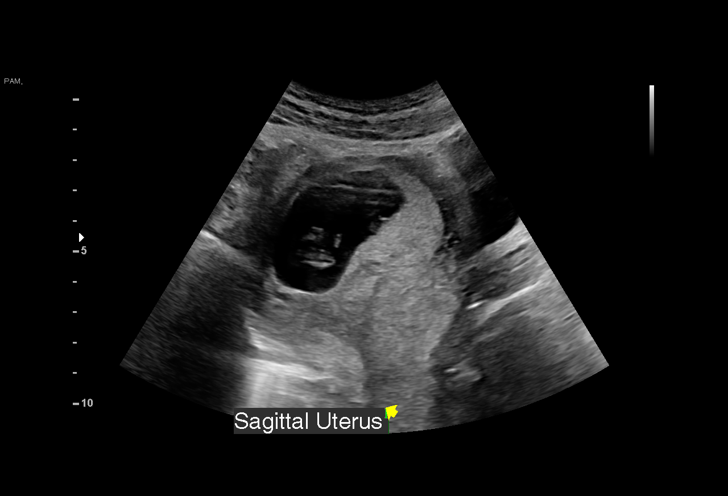

[15 of 27 positions shown; findings below may reference images not displayed]

FINDINGS: Intrauterine gestational sac: Single

Yolk sac:  Visualized

Embryo:  Visualized

Cardiac Activity: Visualized.

Heart Rate: 162 bpm

CRL: 51.2 mm   11 w 6 d                  US EDC: 10/26/2021

Subchorionic hemorrhage:  Small subchorionic hemorrhage

Maternal uterus/adnexae: Normal appearance of the bilateral ovaries.
Gestational sac in the fundus.
IMPRESSION: Single viable intrauterine pregnancy, 11 weeks 6 days by crown-rump
length with a fetal heart rate of 162 beats per minute.

Small subchorionic hemorrhage.

## 2021-07-24 ENCOUNTER — Ambulatory Visit (INDEPENDENT_AMBULATORY_CARE_PROVIDER_SITE_OTHER): Payer: Medicaid Other | Admitting: Family Medicine

## 2021-07-24 ENCOUNTER — Encounter: Payer: Self-pay | Admitting: General Practice

## 2021-07-24 ENCOUNTER — Other Ambulatory Visit: Payer: Self-pay

## 2021-07-24 VITALS — BP 93/62 | HR 73 | Wt 135.0 lb

## 2021-07-24 DIAGNOSIS — Z820 Family history of epilepsy and other diseases of the nervous system: Secondary | ICD-10-CM

## 2021-07-24 DIAGNOSIS — Z3A26 26 weeks gestation of pregnancy: Secondary | ICD-10-CM

## 2021-07-24 DIAGNOSIS — G43809 Other migraine, not intractable, without status migrainosus: Secondary | ICD-10-CM

## 2021-07-24 DIAGNOSIS — Z3482 Encounter for supervision of other normal pregnancy, second trimester: Secondary | ICD-10-CM

## 2021-07-24 DIAGNOSIS — Z23 Encounter for immunization: Secondary | ICD-10-CM | POA: Diagnosis not present

## 2021-07-24 DIAGNOSIS — Z8759 Personal history of other complications of pregnancy, childbirth and the puerperium: Secondary | ICD-10-CM

## 2021-07-24 MED ORDER — FAMOTIDINE 20 MG PO TABS: 20.0000 mg | ORAL_TABLET | Freq: Two times a day (BID) | ORAL | 3 refills | Status: DC

## 2021-07-24 MED ORDER — CYCLOBENZAPRINE HCL 10 MG PO TABS: 5.0000 mg | ORAL_TABLET | Freq: Three times a day (TID) | ORAL | 2 refills | Status: DC | PRN

## 2021-07-24 NOTE — Progress Notes (Signed)
   PRENATAL VISIT NOTE  Subjective:  Linda Wilkins is a 30 y.o. G2P1001 at [redacted]w[redacted]d being seen today for ongoing prenatal care.  She is currently monitored for the following issues for this low-risk pregnancy and has Supervision of normal pregnancy; Family history of spinocerebellar ataxia; and History of postpartum hemorrhage on their problem list.  Patient reports  had headaches last week 3 times. Has history of migraines .  Contractions: Not present. Vag. Bleeding: None.  Movement: Present. Denies leaking of fluid.   The following portions of the patient's history were reviewed and updated as appropriate: allergies, current medications, past family history, past medical history, past social history, past surgical history and problem list.   Objective:   Vitals:   07/24/21 0818  BP: 93/62  Pulse: 73  Weight: 135 lb (61.2 kg)    Fetal Status: Fetal Heart Rate (bpm): 143 Fundal Height: 26 cm Movement: Present     General:  Alert, oriented and cooperative. Patient is in no acute distress.  Skin: Skin is warm and dry. No rash noted.   Cardiovascular: Normal heart rate noted  Respiratory: Normal respiratory effort, no problems with respiration noted  Abdomen: Soft, gravid, appropriate for gestational age.  Pain/Pressure: Absent     Pelvic: Cervical exam deferred        Extremities: Normal range of motion.  Edema: None  Mental Status: Normal mood and affect. Normal behavior. Normal judgment and thought content.   Assessment and Plan:  Pregnancy: G2P1001 at [redacted]w[redacted]d 1. [redacted] weeks gestation of pregnancy - CBC - Glucose Tolerance, 2 Hours w/1 Hour - HIV Antibody (routine testing w rflx) - RPR - Tdap vaccine greater than or equal to 7yo IM  2. Encounter for supervision of other normal pregnancy in second trimester FHT and FH normal - CBC - Glucose Tolerance, 2 Hours w/1 Hour - HIV Antibody (routine testing w rflx) - RPR - Tdap vaccine greater than or equal to 7yo IM  3. Family  history of spinocerebellar ataxia - CBC - Glucose Tolerance, 2 Hours w/1 Hour - HIV Antibody (routine testing w rflx) - RPR - Tdap vaccine greater than or equal to 7yo IM  4. History of postpartum hemorrhage Aggressive management of 3rd stage - CBC - Glucose Tolerance, 2 Hours w/1 Hour - HIV Antibody (routine testing w rflx) - RPR - Tdap vaccine greater than or equal to 7yo IM  5. Other migraine without status migrainosus, not intractable Flexeril prescribed  Preterm labor symptoms and general obstetric precautions including but not limited to vaginal bleeding, contractions, leaking of fluid and fetal movement were reviewed in detail with the patient. Please refer to After Visit Summary for other counseling recommendations.   No follow-ups on file.  Future Appointments  Date Time Provider Department Center  08/05/2021  8:55 AM Aviva Signs, CNM [facility] None  08/21/2021  8:55 AM Levie Heritage, DO [facility] None  09/02/2021  9:15 AM Aviva Signs, CNM [facility] None  09/18/2021  8:35 AM Levie Heritage, DO [facility] None    Levie Heritage, DO

## 2021-07-25 LAB — CBC
Hematocrit: 35.8 % (ref 34.0–46.6)
Hemoglobin: 12.5 g/dL (ref 11.1–15.9)
MCH: 31.8 pg (ref 26.6–33.0)
MCHC: 34.9 g/dL (ref 31.5–35.7)
MCV: 91 fL (ref 79–97)
Platelets: 159 10*3/uL (ref 150–450)
RBC: 3.93 x10E6/uL (ref 3.77–5.28)
RDW: 11.4 % — ABNORMAL LOW (ref 11.7–15.4)
WBC: 8.1 10*3/uL (ref 3.4–10.8)

## 2021-07-25 LAB — GLUCOSE TOLERANCE, 2 HOURS W/ 1HR
Glucose, 1 hour: 116 mg/dL (ref 65–179)
Glucose, 2 hour: 112 mg/dL (ref 65–152)
Glucose, Fasting: 70 mg/dL (ref 65–91)

## 2021-07-25 LAB — RPR: RPR Ser Ql: NONREACTIVE

## 2021-07-25 LAB — HIV ANTIBODY (ROUTINE TESTING W REFLEX): HIV Screen 4th Generation wRfx: NONREACTIVE

## 2021-08-05 ENCOUNTER — Encounter: Payer: Self-pay | Admitting: General Practice

## 2021-08-05 ENCOUNTER — Other Ambulatory Visit: Payer: Self-pay

## 2021-08-05 ENCOUNTER — Ambulatory Visit (INDEPENDENT_AMBULATORY_CARE_PROVIDER_SITE_OTHER): Payer: Medicaid Other | Admitting: Advanced Practice Midwife

## 2021-08-05 VITALS — BP 96/53 | HR 81 | Wt 138.0 lb

## 2021-08-05 DIAGNOSIS — Z3A28 28 weeks gestation of pregnancy: Secondary | ICD-10-CM

## 2021-08-05 NOTE — Patient Instructions (Signed)
Thinking About [employer]en Dolin???  You must attend a [employer] class at [facility] 3rd Wednesday of every month from 7-9pm [employer] by calling [phone] or online at [email or web address] Bring [place] the certificate from the class   Things that would prevent you from having a waterbirth: Premature, <37wks Previous cesarean birth Presence of thick meconium-stained fluid Multiple gestation (Twins, triplets, etc.) Uncontrolled diabetes Hypertension Heavy vaginal bleeding Non-reassuring fetal heart rate Active infection (MRSA, etc.) If your labor has to be induced Other risk issues identified by your obstetrical provider

## 2021-08-05 NOTE — Progress Notes (Signed)
   PRENATAL VISIT NOTE  Subjective:  Linda Wilkins is a 30 y.o. G2P1001 at [redacted]w[redacted]d being seen today for ongoing prenatal care.  She is currently monitored for the following issues for this low-risk pregnancy and has Supervision of normal pregnancy; Family history of spinocerebellar ataxia; and History of postpartum hemorrhage on their problem list.  Patient reports no complaints.  Contractions: Not present. Vag. Bleeding: None.  Movement: Present. Denies leaking of fluid.   The following portions of the patient's history were reviewed and updated as appropriate: allergies, current medications, past family history, past medical history, past social history, past surgical history and problem list.   Objective:   Vitals:   08/05/21 0844  BP: (!) 96/53  Pulse: 81  Weight: 138 lb (62.6 kg)    Fetal Status: Fetal Heart Rate (bpm): 150   Movement: Present     General:  Alert, oriented and cooperative. Patient is in no acute distress.  Skin: Skin is warm and dry. No rash noted.   Cardiovascular: Normal heart rate noted  Respiratory: Normal respiratory effort, no problems with respiration noted  Abdomen: Soft, gravid, appropriate for gestational age.  Pain/Pressure: Absent     Pelvic: Cervical exam deferred        Extremities: Normal range of motion.  Edema: None  Mental Status: Normal mood and affect. Normal behavior. Normal judgment and thought content.   Assessment and Plan:  Pregnancy: G2P1001 at [redacted]w[redacted]d 1. [redacted] weeks gestation of pregnancy     Waterbirth discussed  Reviewed risks/benefits and exclusion criteria     Consent signed     Advised she needs to go to class and bring certificate  Preterm labor symptoms and general obstetric precautions including but not limited to vaginal bleeding, contractions, leaking of fluid and fetal movement were reviewed in detail with the patient. Please refer to After Visit Summary for other counseling recommendations.   Return in about 2 weeks  (around 08/19/2021) for [facility].  Future Appointments  Date Time Provider Department Center  08/21/2021  8:55 AM Levie Heritage, DO [facility] None  09/02/2021  9:15 AM Aviva Signs, CNM [facility] None  09/18/2021  8:35 AM Adrian Blackwater Rhona Raider, DO [facility] None    Wynelle Bourgeois, CNM

## 2021-08-21 ENCOUNTER — Ambulatory Visit (INDEPENDENT_AMBULATORY_CARE_PROVIDER_SITE_OTHER): Payer: Medicaid Other | Admitting: Family Medicine

## 2021-08-21 ENCOUNTER — Other Ambulatory Visit: Payer: Self-pay

## 2021-08-21 VITALS — BP 97/62 | HR 82 | Wt 142.0 lb

## 2021-08-21 DIAGNOSIS — Z3482 Encounter for supervision of other normal pregnancy, second trimester: Secondary | ICD-10-CM

## 2021-08-21 DIAGNOSIS — Z3A30 30 weeks gestation of pregnancy: Secondary | ICD-10-CM

## 2021-08-21 DIAGNOSIS — Z8759 Personal history of other complications of pregnancy, childbirth and the puerperium: Secondary | ICD-10-CM

## 2021-08-21 DIAGNOSIS — Z820 Family history of epilepsy and other diseases of the nervous system: Secondary | ICD-10-CM

## 2021-08-21 NOTE — Progress Notes (Signed)
   PRENATAL VISIT NOTE  Subjective:  Linda Wilkins is a 30 y.o. G2P1001 at [redacted]w[redacted]d being seen today for ongoing prenatal care.  She is currently monitored for the following issues for this low-risk pregnancy and has Supervision of normal pregnancy; Family history of spinocerebellar ataxia; and History of postpartum hemorrhage on their problem list.  Patient reports  intermittently leaking fluid for the past couple of days .  Not sure if it's Contractions: Not present. Vag. Bleeding: None.  Movement: Present. Denies leaking of fluid.   The following portions of the patient's history were reviewed and updated as appropriate: allergies, current medications, past family history, past medical history, past social history, past surgical history and problem list.   Objective:   Vitals:   08/21/21 0854  BP: 97/62  Pulse: 82  Weight: 142 lb (64.4 kg)    Fetal Status: Fetal Heart Rate (bpm): 165   Movement: Present     General:  Alert, oriented and cooperative. Patient is in no acute distress.  Skin: Skin is warm and dry. No rash noted.   Cardiovascular: Normal heart rate noted  Respiratory: Normal respiratory effort, no problems with respiration noted  Abdomen: Soft, gravid, appropriate for gestational age.  Pain/Pressure: Present     Pelvic: Chaperone present - Pooling negative, no fluid from cervix with valsalva. Nitrizine neg.         Extremities: Normal range of motion.  Edema: None  Mental Status: Normal mood and affect. Normal behavior. Normal judgment and thought content.   Assessment and Plan:  Pregnancy: G2P1001 at [redacted]w[redacted]d 1. [redacted] weeks gestation of pregnancy  2. Encounter for supervision of other normal pregnancy in second trimester FHT and FH normal  3. Family history of spinocerebellar ataxia Continue folic acid  4. History of postpartum hemorrhage Aggressive 3rd stage management  Preterm labor symptoms and general obstetric precautions including but not limited to vaginal  bleeding, contractions, leaking of fluid and fetal movement were reviewed in detail with the patient. Please refer to After Visit Summary for other counseling recommendations.   No follow-ups on file.  Future Appointments  Date Time Provider Department Center  09/02/2021  9:15 AM Aviva Signs, CNM [facility] None  09/18/2021  8:35 AM Levie Heritage, DO [facility] None    Levie Heritage, DO

## 2021-09-02 ENCOUNTER — Other Ambulatory Visit: Payer: Self-pay

## 2021-09-02 ENCOUNTER — Encounter: Payer: Self-pay | Admitting: Advanced Practice Midwife

## 2021-09-02 ENCOUNTER — Ambulatory Visit (INDEPENDENT_AMBULATORY_CARE_PROVIDER_SITE_OTHER): Payer: Medicaid Other | Admitting: Advanced Practice Midwife

## 2021-09-02 VITALS — BP 97/60 | HR 82 | Wt 143.0 lb

## 2021-09-02 DIAGNOSIS — Z8759 Personal history of other complications of pregnancy, childbirth and the puerperium: Secondary | ICD-10-CM

## 2021-09-02 DIAGNOSIS — Z3A32 32 weeks gestation of pregnancy: Secondary | ICD-10-CM

## 2021-09-02 NOTE — Progress Notes (Signed)
   PRENATAL VISIT NOTE  Subjective:  Linda Wilkins is a 30 y.o. G2P1001 at [redacted]w[redacted]d being seen today for ongoing prenatal care.  She is currently monitored for the following issues for this low-risk pregnancy and has Supervision of normal pregnancy; Family history of spinocerebellar ataxia; History of postpartum hemorrhage; and Subchorionic hematoma on their problem list.  Patient reports no complaints.  Contractions: Irritability. Vag. Bleeding: None.  Movement: Present. Denies leaking of fluid.   The following portions of the patient's history were reviewed and updated as appropriate: allergies, current medications, past family history, past medical history, past social history, past surgical history and problem list.   Objective:   Vitals:   09/02/21 0911  BP: 97/60  Pulse: 82  Weight: 143 lb (64.9 kg)    Fetal Status: Fetal Heart Rate (bpm): 152 Fundal Height: 31 cm Movement: Present     General:  Alert, oriented and cooperative. Patient is in no acute distress.  Skin: Skin is warm and dry. No rash noted.   Cardiovascular: Normal heart rate noted  Respiratory: Normal respiratory effort, no problems with respiration noted  Abdomen: Soft, gravid, appropriate for gestational age.  Pain/Pressure: Absent     Pelvic: Cervical exam deferred        Extremities: Normal range of motion.  Edema: Trace  Mental Status: Normal mood and affect. Normal behavior. Normal judgment and thought content.   Assessment and Plan:  Pregnancy: G2P1001 at [redacted]w[redacted]d 1. [redacted] weeks gestation of pregnancy     Measured 31cm  Discussed if still measures smaller next visit may want to schedule [place]      Decided against waterbirth  2. History of postpartum hemorrhage     Active 3rd stage management  Preterm labor symptoms and general obstetric precautions including but not limited to vaginal bleeding, contractions, leaking of fluid and fetal movement were reviewed in detail with the patient. Please refer to After  Visit Summary for other counseling recommendations.   Return in about 2 weeks (around 09/16/2021) for [facility].  Future Appointments  Date Time Provider Department Center  09/18/2021  8:35 AM Levie Heritage, DO [facility] None  10/02/2021  8:35 AM Levie Heritage, DO [facility] None  10/09/2021  8:35 AM Levie Heritage, DO [facility] None  10/15/2021 11:15 AM Levie Heritage, DO [facility] None  10/23/2021  8:35 AM Adrian Blackwater, Rhona Raider, DO [facility] None    Wynelle Bourgeois, CNM

## 2021-09-18 ENCOUNTER — Ambulatory Visit (INDEPENDENT_AMBULATORY_CARE_PROVIDER_SITE_OTHER): Payer: Medicaid Other | Admitting: Family Medicine

## 2021-09-18 ENCOUNTER — Other Ambulatory Visit: Payer: Self-pay

## 2021-09-18 VITALS — BP 99/65 | HR 107 | Wt 146.0 lb

## 2021-09-18 DIAGNOSIS — Z3A34 34 weeks gestation of pregnancy: Secondary | ICD-10-CM

## 2021-09-18 DIAGNOSIS — Z8759 Personal history of other complications of pregnancy, childbirth and the puerperium: Secondary | ICD-10-CM

## 2021-09-18 DIAGNOSIS — Z820 Family history of epilepsy and other diseases of the nervous system: Secondary | ICD-10-CM

## 2021-09-18 DIAGNOSIS — Z3482 Encounter for supervision of other normal pregnancy, second trimester: Secondary | ICD-10-CM

## 2021-09-18 NOTE — Progress Notes (Signed)
   PRENATAL VISIT NOTE  Subjective:  Linda Wilkins is a 30 y.o. G2P1001 at [redacted]w[redacted]d being seen today for ongoing prenatal care.  She is currently monitored for the following issues for this low-risk pregnancy and has Supervision of normal pregnancy; Family history of spinocerebellar ataxia; History of postpartum hemorrhage; and Subchorionic hematoma on their problem list.  Patient reports no complaints.  Contractions: Irritability. Vag. Bleeding: None.  Movement: Present. Denies leaking of fluid.   The following portions of the patient's history were reviewed and updated as appropriate: allergies, current medications, past family history, past medical history, past social history, past surgical history and problem list.   Objective:   Vitals:   09/18/21 0838  BP: 99/65  Pulse: (!) 107  Weight: 146 lb (66.2 kg)    Fetal Status: Fetal Heart Rate (bpm): 145 Fundal Height: 33 cm Movement: Present     General:  Alert, oriented and cooperative. Patient is in no acute distress.  Skin: Skin is warm and dry. No rash noted.   Cardiovascular: Normal heart rate noted  Respiratory: Normal respiratory effort, no problems with respiration noted  Abdomen: Soft, gravid, appropriate for gestational age.  Pain/Pressure: Present     Pelvic: Cervical exam deferred        Extremities: Normal range of motion.  Edema: Trace  Mental Status: Normal mood and affect. Normal behavior. Normal judgment and thought content.   Assessment and Plan:  Pregnancy: G2P1001 at [redacted]w[redacted]d 1. [redacted] weeks gestation of pregnancy FHT and FH. Would like elective induction around 39 weeks  2. Encounter for supervision of other normal pregnancy in second trimester   3. History of postpartum hemorrhage Aggressive management of 3rd stage of labor  4. Family history of spinocerebellar ataxia On folic acid  Preterm labor symptoms and general obstetric precautions including but not limited to vaginal bleeding, contractions, leaking  of fluid and fetal movement were reviewed in detail with the patient. Please refer to After Visit Summary for other counseling recommendations.   No follow-ups on file.  Future Appointments  Date Time Provider Department Center  10/02/2021  8:35 AM Levie Heritage, DO [facility] None  10/09/2021  8:35 AM Levie Heritage, DO [facility] None  10/15/2021 11:15 AM Levie Heritage, DO [facility] None  10/23/2021  8:35 AM Levie Heritage, DO [facility] None    Levie Heritage, DO

## 2021-10-02 ENCOUNTER — Other Ambulatory Visit (HOSPITAL_COMMUNITY)
Admission: RE | Admit: 2021-10-02 | Discharge: 2021-10-02 | Disposition: A | Discharge status: Home / Self Care | Payer: Medicaid Other | Source: Ambulatory Visit | Attending: Family Medicine | Admitting: Family Medicine

## 2021-10-02 ENCOUNTER — Other Ambulatory Visit: Payer: Self-pay

## 2021-10-02 ENCOUNTER — Ambulatory Visit (INDEPENDENT_AMBULATORY_CARE_PROVIDER_SITE_OTHER): Payer: Medicaid Other | Admitting: Family Medicine

## 2021-10-02 VITALS — BP 91/69 | HR 86 | Wt 147.0 lb

## 2021-10-02 DIAGNOSIS — Z3483 Encounter for supervision of other normal pregnancy, third trimester: Secondary | ICD-10-CM | POA: Diagnosis present

## 2021-10-02 DIAGNOSIS — Q057 Lumbar spina bifida without hydrocephalus: Secondary | ICD-10-CM | POA: Insufficient documentation

## 2021-10-02 DIAGNOSIS — Z8759 Personal history of other complications of pregnancy, childbirth and the puerperium: Secondary | ICD-10-CM

## 2021-10-02 DIAGNOSIS — Z3A36 36 weeks gestation of pregnancy: Secondary | ICD-10-CM

## 2021-10-02 DIAGNOSIS — Z820 Family history of epilepsy and other diseases of the nervous system: Secondary | ICD-10-CM

## 2021-10-02 DIAGNOSIS — Z3480 Encounter for supervision of other normal pregnancy, unspecified trimester: Secondary | ICD-10-CM

## 2021-10-02 NOTE — Progress Notes (Signed)
   PRENATAL VISIT NOTE  Subjective:  Linda Wilkins is a 30 y.o. G2P1001 at [redacted]w[redacted]d being seen today for ongoing prenatal care.  She is currently monitored for the following issues for this high-risk pregnancy and has Supervision of normal pregnancy; Family history of spinocerebellar ataxia; History of postpartum hemorrhage; and Subchorionic hematoma on their problem list.  Patient reports no complaints.  Contractions: Irritability. Vag. Bleeding: None.  Movement: Present. Denies leaking of fluid.   The following portions of the patient's history were reviewed and updated as appropriate: allergies, current medications, past family history, past medical history, past social history, past surgical history and problem list.   Objective:   Vitals:   10/02/21 0835  BP: 91/69  Pulse: 86  Weight: 147 lb (66.7 kg)    Fetal Status: Fetal Heart Rate (bpm): 147 Fundal Height: 36 cm Movement: Present  Presentation: Vertex  General:  Alert, oriented and cooperative. Patient is in no acute distress.  Skin: Skin is warm and dry. No rash noted.   Cardiovascular: Normal heart rate noted  Respiratory: Normal respiratory effort, no problems with respiration noted  Abdomen: Soft, gravid, appropriate for gestational age.  Pain/Pressure: Present     Pelvic: Cervical exam deferred Dilation: 2 Effacement (%): 60 Station: -3  Extremities: Normal range of motion.  Edema: Trace  Mental Status: Normal mood and affect. Normal behavior. Normal judgment and thought content.   Assessment and Plan:  Pregnancy: G2P1001 at [redacted]w[redacted]d 1. [redacted] weeks gestation of pregnancy - Culture, beta strep (group b only) - GC/Chlamydia probe amp ([place])not at [facility]  2. Supervision of other normal pregnancy, antepartum FHT and FH normal - Culture, beta strep (group b only) - GC/Chlamydia probe amp ([place])not at [facility]  3. Family history of spinocerebellar ataxia   4. History of postpartum hemorrhage Aggressive management  of 3rd stage  5. 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 AFP negative On folic acid  Preterm labor symptoms and general obstetric precautions including but not limited to vaginal bleeding, contractions, leaking of fluid and fetal movement were reviewed in detail with the patient. Please refer to After Visit Summary for other counseling recommendations.   No follow-ups on file.  Future Appointments  Date Time Provider Department Center  10/09/2021  8:35 AM Levie Heritage, DO [facility] None  10/15/2021  2:50 PM Willodean Rosenthal, MD [facility] None  10/23/2021  8:35 AM Levie Heritage, DO [facility] None    Levie Heritage, DO

## 2021-10-03 LAB — GC/CHLAMYDIA PROBE AMP (~~LOC~~) NOT AT ARMC
Chlamydia: NEGATIVE
Comment: NEGATIVE
Comment: NORMAL
Neisseria Gonorrhea: NEGATIVE

## 2021-10-06 LAB — CULTURE, BETA STREP (GROUP B ONLY): Strep Gp B Culture: NEGATIVE

## 2021-10-09 ENCOUNTER — Other Ambulatory Visit: Payer: Self-pay

## 2021-10-09 ENCOUNTER — Ambulatory Visit (INDEPENDENT_AMBULATORY_CARE_PROVIDER_SITE_OTHER): Payer: Medicaid Other | Admitting: Family Medicine

## 2021-10-09 VITALS — BP 100/60 | HR 83 | Wt 151.0 lb

## 2021-10-09 DIAGNOSIS — Z8759 Personal history of other complications of pregnancy, childbirth and the puerperium: Secondary | ICD-10-CM

## 2021-10-09 DIAGNOSIS — Z3480 Encounter for supervision of other normal pregnancy, unspecified trimester: Secondary | ICD-10-CM

## 2021-10-09 DIAGNOSIS — Q057 Lumbar spina bifida without hydrocephalus: Secondary | ICD-10-CM

## 2021-10-09 NOTE — Progress Notes (Signed)
   PRENATAL VISIT NOTE  Subjective:  Linda Wilkins is a 30 y.o. G2P1001 at [redacted]w[redacted]d being seen today for ongoing prenatal care.  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  Patient reports no complaints.  Contractions: Irritability. Vag. Bleeding: None.  Movement: Present. Denies leaking of fluid.   The following portions of the patient's history were reviewed and updated as appropriate: allergies, current medications, past family history, past medical history, past social history, past surgical history and problem list.   Objective:   Vitals:   10/09/21 0833  BP: 100/60  Pulse: 83  Weight: 151 lb (68.5 kg)    Fetal Status: Fetal Heart Rate (bpm): 142 Fundal Height: 37 cm Movement: Present  Presentation: Vertex  General:  Alert, oriented and cooperative. Patient is in no acute distress.  Skin: Skin is warm and dry. No rash noted.   Cardiovascular: Normal heart rate noted  Respiratory: Normal respiratory effort, no problems with respiration noted  Abdomen: Soft, gravid, appropriate for gestational age.  Pain/Pressure: Present     Pelvic: Cervical exam performed in the presence of a chaperone Dilation: 2.5 Effacement (%): 60 Station: -3  Extremities: Normal range of motion.  Edema: Trace  Mental Status: Normal mood and affect. Normal behavior. Normal judgment and thought content.   Assessment and Plan:  Pregnancy: G2P1001 at [redacted]w[redacted]d 1. Supervision of other normal pregnancy, antepartum FHT and FH normal  2. 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 History of postpartum hemorrhage   Term labor symptoms and general obstetric precautions including but not limited to vaginal bleeding, contractions,  leaking of fluid and fetal movement were reviewed in detail with the patient. Please refer to After Visit Summary for other counseling recommendations.   No follow-ups on file.  Future Appointments  Date Time Provider Department Center  10/15/2021  2:50 PM Willodean Rosenthal, MD [facility] None  11/26/2021 10:35 AM Levie Heritage, DO [facility] None    Levie Heritage, DO

## 2021-10-09 NOTE — Addendum Note (Signed)
Addended by: Levie Heritage on: 10/09/2021 09:01 AM   Modules accepted: Orders

## 2021-10-13 ENCOUNTER — Other Ambulatory Visit: Payer: Self-pay | Admitting: Advanced Practice Midwife

## 2021-10-15 ENCOUNTER — Other Ambulatory Visit: Payer: Self-pay

## 2021-10-15 ENCOUNTER — Encounter: Payer: Self-pay | Admitting: Obstetrics & Gynecology

## 2021-10-15 ENCOUNTER — Encounter: Payer: Medicaid Other | Admitting: Family Medicine

## 2021-10-15 ENCOUNTER — Ambulatory Visit (INDEPENDENT_AMBULATORY_CARE_PROVIDER_SITE_OTHER): Payer: Medicaid Other | Admitting: Obstetrics & Gynecology

## 2021-10-15 VITALS — BP 87/73 | HR 93 | Wt 148.0 lb

## 2021-10-15 DIAGNOSIS — Z3403 Encounter for supervision of normal first pregnancy, third trimester: Secondary | ICD-10-CM

## 2021-10-15 DIAGNOSIS — Z8759 Personal history of other complications of pregnancy, childbirth and the puerperium: Secondary | ICD-10-CM

## 2021-10-15 DIAGNOSIS — Q057 Lumbar spina bifida without hydrocephalus: Secondary | ICD-10-CM

## 2021-10-15 DIAGNOSIS — Z3A38 38 weeks gestation of pregnancy: Secondary | ICD-10-CM

## 2021-10-15 NOTE — Progress Notes (Signed)
   PRENATAL VISIT NOTE  Subjective:  Linda Wilkins is a 30 y.o. G2P1001 at [redacted]w[redacted]d being seen today for ongoing prenatal care.  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  Patient reports occasional contractions.  Contractions: Irritability. Vag. Bleeding: None.  Movement: Present. Denies leaking of fluid.   The following portions of the patient's history were reviewed and updated as appropriate: allergies, current medications, past family history, past medical history, past social history, past surgical history and problem list.   Objective:   Vitals:   10/15/21 1453  BP: (!) 87/73  Pulse: 93  Weight: 148 lb (67.1 kg)    Fetal Status: Fetal Heart Rate (bpm): 143   Movement: Present     General:  Alert, oriented and cooperative. Patient is in no acute distress.  Skin: Skin is warm and dry. No rash noted.   Cardiovascular: Normal heart rate noted  Respiratory: Normal respiratory effort, no problems with respiration noted  Abdomen: Soft, gravid, appropriate for gestational age.  Pain/Pressure: Present     Pelvic: Cervical exam deferred        Extremities: Normal range of motion.  Edema: None  Mental Status: Normal mood and affect. Normal behavior. Normal judgment and thought content.   Assessment and Plan:  Pregnancy: G2P1001 at [redacted]w[redacted]d 1. [redacted] weeks gestation of pregnancy FH and FHR  2. Encounter for supervision of normal first pregnancy in third trimester  3. Spina bifida of lumbosacral region without hydrocephalus [facility]) Personal history - asymptomatic. AFP negative On folic acid  4. History of postpartum hemorrhage Managed with meds  Term labor symptoms and general obstetric precautions including but not limited  to vaginal bleeding, contractions, leaking of fluid and fetal movement were reviewed in detail with the patient. Please refer to After Visit Summary for other counseling recommendations.   Return in about 1 week (around 10/22/2021).  Future Appointments  Date Time Provider Department Center  10/21/2021  6:45 AM [facility] SCHED ROOM [facility] None  11/26/2021 10:35 AM Adrian Blackwater Rhona Raider, DO [facility] None    Willodean Rosenthal, MD

## 2021-10-21 ENCOUNTER — Inpatient Hospital Stay (HOSPITAL_COMMUNITY): Admission: AD | Admit: 2021-10-21 | Payer: Medicaid Other | Source: Home / Self Care | Admitting: Family Medicine

## 2021-10-21 ENCOUNTER — Inpatient Hospital Stay (HOSPITAL_COMMUNITY): Payer: Medicaid Other

## 2021-10-22 ENCOUNTER — Inpatient Hospital Stay (HOSPITAL_COMMUNITY)
Admission: AD | Admit: 2021-10-22 | Discharge: 2021-10-25 | DRG: 807 | Disposition: A | Discharge status: Home / Self Care | Payer: Medicaid Other | Attending: Obstetrics and Gynecology | Admitting: Obstetrics and Gynecology

## 2021-10-22 ENCOUNTER — Encounter (HOSPITAL_COMMUNITY): Payer: Self-pay | Admitting: Obstetrics & Gynecology

## 2021-10-22 ENCOUNTER — Inpatient Hospital Stay (HOSPITAL_COMMUNITY): Payer: Medicaid Other

## 2021-10-22 DIAGNOSIS — Z23 Encounter for immunization: Secondary | ICD-10-CM | POA: Diagnosis not present

## 2021-10-22 DIAGNOSIS — Z87891 Personal history of nicotine dependence: Secondary | ICD-10-CM | POA: Diagnosis not present

## 2021-10-22 DIAGNOSIS — Z20822 Contact with and (suspected) exposure to covid-19: Secondary | ICD-10-CM | POA: Diagnosis present

## 2021-10-22 DIAGNOSIS — Z3A39 39 weeks gestation of pregnancy: Secondary | ICD-10-CM | POA: Diagnosis not present

## 2021-10-22 DIAGNOSIS — Z3490 Encounter for supervision of normal pregnancy, unspecified, unspecified trimester: Secondary | ICD-10-CM | POA: Diagnosis present

## 2021-10-22 DIAGNOSIS — O99354 Diseases of the nervous system complicating childbirth: Secondary | ICD-10-CM | POA: Diagnosis present

## 2021-10-22 DIAGNOSIS — Q760 Spina bifida occulta: Secondary | ICD-10-CM | POA: Diagnosis not present

## 2021-10-22 DIAGNOSIS — O26893 Other specified pregnancy related conditions, third trimester: Secondary | ICD-10-CM | POA: Diagnosis present

## 2021-10-22 MED ORDER — TERBUTALINE SULFATE 1 MG/ML IJ SOLN: 0.2500 mg | Freq: Once | INTRAMUSCULAR | Status: DC | PRN

## 2021-10-22 MED ORDER — LACTATED RINGERS IV SOLN: INTRAVENOUS | Status: DC

## 2021-10-22 MED ORDER — OXYTOCIN-SODIUM CHLORIDE 30-0.9 UT/500ML-% IV SOLN: 2.5000 [IU]/h | INTRAVENOUS | Status: DC

## 2021-10-22 MED ORDER — LIDOCAINE HCL (PF) 1 % IJ SOLN: 30.0000 mL | INTRAMUSCULAR | Status: DC | PRN

## 2021-10-22 MED ORDER — OXYCODONE-ACETAMINOPHEN 5-325 MG PO TABS: 2.0000 | ORAL_TABLET | ORAL | Status: DC | PRN

## 2021-10-22 MED ORDER — OXYCODONE-ACETAMINOPHEN 5-325 MG PO TABS: 1.0000 | ORAL_TABLET | ORAL | Status: DC | PRN

## 2021-10-22 MED ORDER — OXYTOCIN-SODIUM CHLORIDE 30-0.9 UT/500ML-% IV SOLN
1.0000 m[IU]/min | INTRAVENOUS | Status: DC
  Administered 2021-10-23: 2 m[IU]/min via INTRAVENOUS
  Filled 2021-10-22: qty 500

## 2021-10-22 MED ORDER — SOD CITRATE-CITRIC ACID 500-334 MG/5ML PO SOLN
30.0000 mL | ORAL | Status: DC | PRN
  Administered 2021-10-23 (×2): 30 mL via ORAL
  Filled 2021-10-22 (×2): qty 30

## 2021-10-22 MED ORDER — OXYTOCIN BOLUS FROM INFUSION
333.0000 mL | Freq: Once | INTRAVENOUS | Status: AC
  Administered 2021-10-23: 333 mL via INTRAVENOUS

## 2021-10-22 MED ORDER — ACETAMINOPHEN 325 MG PO TABS: 650.0000 mg | ORAL_TABLET | ORAL | Status: DC | PRN

## 2021-10-22 MED ORDER — ONDANSETRON HCL 4 MG/2ML IJ SOLN
4.0000 mg | Freq: Four times a day (QID) | INTRAMUSCULAR | Status: DC | PRN
  Administered 2021-10-23: 4 mg via INTRAVENOUS
  Filled 2021-10-22: qty 2

## 2021-10-22 MED ORDER — LACTATED RINGERS IV SOLN: 500.0000 mL | INTRAVENOUS | Status: DC | PRN

## 2021-10-22 NOTE — H&P (Signed)
Linda Wilkins is a 30 y.o. female presenting for elective induction of labor.   Last labor was reportedly long and she had a postpartum hemorrhage requiring two meds for resolution  Pregnancy has been followed at [facility].and remarkable for: Patient Active Problem List   Diagnosis Date Noted   Encounter for induction of labor 10/22/2021   Spina bifida of lumbosacral region without hydrocephalus ([place]) 10/02/2021   Subchorionic hematoma 04/12/2021   Family history of spinocerebellar ataxia 04/02/2021   History of postpartum hemorrhage 04/02/2021   Supervision of normal pregnancy 04/01/2021  Has been taking Folic Acid for FH SCA and personal hx Spina Bifida  OB History     Gravida  2   Para  1   Term  1   Preterm      AB      Living  1      SAB      IAB      Ectopic      Multiple      Live Births  1          Past Medical History:  Diagnosis Date   Hx of migraines    Spina bifida ([place])    Spina bifida ([place])    Past Surgical History:  Procedure Laterality Date   adnoids     TYMPANOSTOMY TUBE PLACEMENT     Family History: family history includes Hypothyroidism in her mother; Lumbar disc disease in her mother; Rheum arthritis in her mother. Social History:  reports that she has quit smoking. Her smoking use included cigarettes. She has never used smokeless tobacco. She reports that she does not currently use alcohol. She reports that she does not currently use drugs.     Maternal Diabetes: No Genetic Screening: Normal Maternal Ultrasounds/Referrals: Normal Fetal Ultrasounds or other Referrals:  None Maternal Substance Abuse:  No Significant Maternal Medications:  None Significant Maternal Lab Results:  Group B Strep negative Other Comments:  None  Review of Systems  Constitutional:  Negative for chills and fever.  Eyes:  Negative for visual disturbance.  Respiratory:  Negative for shortness of breath.   Gastrointestinal:  Negative for abdominal pain,  constipation, diarrhea, nausea and vomiting.  Genitourinary:  Negative for vaginal bleeding.  Neurological:  Negative for weakness.  Maternal Medical History:  Reason for admission: Nausea. Elective Induction of Labor  Contractions: Frequency: irregular.   Perceived severity is mild.   Fetal activity: Perceived fetal activity is normal.   Last perceived fetal movement was within the past hour.   Prenatal complications: No bleeding, PIH, placental abnormality, pre-eclampsia or preterm labor.   Prenatal Complications - Diabetes: none.    Last menstrual period 01/20/2021. Maternal Exam:  Uterine Assessment: Contraction strength is mild.  Contraction frequency is irregular.  Abdomen: Patient reports no abdominal tenderness. Estimated fetal weight is 7lb.   Fetal presentation: vertex Introitus: Normal vulva. Normal vagina.  Ferning test: not done.  Nitrazine test: not done. Amniotic fluid character: not assessed. Pelvis: adequate for delivery.   Cervix: Cervix evaluated by digital exam.     Fetal Exam Fetal Monitor Review: Mode: ultrasound.   Baseline rate: 140.  Variability: moderate (6-25 bpm).   Pattern: accelerations present and no decelerations.   Fetal State Assessment: Category I - tracings are normal.  Physical Exam Constitutional:      General: She is not in acute distress.    Appearance: She is not ill-appearing or toxic-appearing.  HENT:     Head: Normocephalic.  Cardiovascular:  Rate and Rhythm: Normal rate.  Pulmonary:     Effort: Pulmonary effort is normal.  Abdominal:     General: There is no distension.     Tenderness: There is no abdominal tenderness. There is no guarding.  Genitourinary:    General: Normal vulva.     Comments: Dilation: 2.5 Effacement (%): 60 Station: -2 Presentation: Vertex Exam by:: Wynelle Bourgeois CNM  Musculoskeletal:        General: Normal range of motion.     Cervical back: Normal range of motion.  Skin:    General: Skin  is warm and dry.  Neurological:     General: No focal deficit present.     Mental Status: She is alert.  Psychiatric:        Mood and Affect: Mood normal.    Prenatal labs: ABO, Rh: B/Positive/-- (05/11 1614) Antibody: Negative (05/11 1614) Rubella: 23.50 (05/11 1614) RPR: Non Reactive (09/01 0846)  HBsAg: Negative (05/11 1614)  HIV: Non Reactive (09/01 0846)  GBS: Negative/-- (11/10 4235)   Assessment/Plan: Single IUP at [redacted]w[redacted]d Favorable Cervix Elective Induction of Labor  Admit to Labor and Delivery Routine orders Pitocin Induction  Epidural PRN Active Management of the Third Stage    Wynelle Bourgeois 10/22/2021, 11:36 PM

## 2021-10-23 ENCOUNTER — Inpatient Hospital Stay (HOSPITAL_COMMUNITY): Payer: Medicaid Other | Admitting: Anesthesiology

## 2021-10-23 ENCOUNTER — Other Ambulatory Visit: Payer: Self-pay

## 2021-10-23 ENCOUNTER — Encounter (HOSPITAL_COMMUNITY): Payer: Self-pay | Admitting: Family Medicine

## 2021-10-23 ENCOUNTER — Encounter: Payer: Medicaid Other | Admitting: Family Medicine

## 2021-10-23 DIAGNOSIS — Z3A39 39 weeks gestation of pregnancy: Secondary | ICD-10-CM

## 2021-10-23 LAB — TYPE AND SCREEN
ABO/RH(D): B POS
Antibody Screen: NEGATIVE

## 2021-10-23 LAB — CBC
HCT: 38 % (ref 36.0–46.0)
Hemoglobin: 13.2 g/dL (ref 12.0–15.0)
MCH: 31.7 pg (ref 26.0–34.0)
MCHC: 34.7 g/dL (ref 30.0–36.0)
MCV: 91.1 fL (ref 80.0–100.0)
Platelets: 142 10*3/uL — ABNORMAL LOW (ref 150–400)
RBC: 4.17 MIL/uL (ref 3.87–5.11)
RDW: 12.6 % (ref 11.5–15.5)
WBC: 8.5 10*3/uL (ref 4.0–10.5)
nRBC: 0 % (ref 0.0–0.2)

## 2021-10-23 LAB — RESP PANEL BY RT-PCR (FLU A&B, COVID) ARPGX2
Influenza A by PCR: NEGATIVE
Influenza B by PCR: NEGATIVE
SARS Coronavirus 2 by RT PCR: NEGATIVE

## 2021-10-23 LAB — RPR: RPR Ser Ql: NONREACTIVE

## 2021-10-23 MED ORDER — BENZOCAINE-MENTHOL 20-0.5 % EX AERO: 1.0000 "application " | INHALATION_SPRAY | CUTANEOUS | Status: DC | PRN

## 2021-10-23 MED ORDER — ZOLPIDEM TARTRATE 5 MG PO TABS: 5.0000 mg | ORAL_TABLET | Freq: Every evening | ORAL | Status: DC | PRN

## 2021-10-23 MED ORDER — PHENYLEPHRINE 40 MCG/ML (10ML) SYRINGE FOR IV PUSH (FOR BLOOD PRESSURE SUPPORT)
80.0000 ug | PREFILLED_SYRINGE | INTRAVENOUS | Status: DC | PRN
  Filled 2021-10-23: qty 10

## 2021-10-23 MED ORDER — EPHEDRINE 5 MG/ML INJ: 10.0000 mg | INTRAVENOUS | Status: DC | PRN

## 2021-10-23 MED ORDER — PRENATAL MULTIVITAMIN CH
1.0000 | ORAL_TABLET | Freq: Every day | ORAL | Status: DC
  Administered 2021-10-24: 1 via ORAL
  Filled 2021-10-23: qty 1

## 2021-10-23 MED ORDER — FENTANYL-BUPIVACAINE-NACL 0.5-0.125-0.9 MG/250ML-% EP SOLN
12.0000 mL/h | EPIDURAL | Status: DC | PRN
  Administered 2021-10-23: 12 mL/h via EPIDURAL
  Filled 2021-10-23: qty 250

## 2021-10-23 MED ORDER — DIPHENHYDRAMINE HCL 25 MG PO CAPS: 25.0000 mg | ORAL_CAPSULE | Freq: Four times a day (QID) | ORAL | Status: DC | PRN

## 2021-10-23 MED ORDER — COCONUT OIL OIL: 1.0000 "application " | TOPICAL_OIL | Status: DC | PRN

## 2021-10-23 MED ORDER — WITCH HAZEL-GLYCERIN EX PADS: 1.0000 "application " | MEDICATED_PAD | CUTANEOUS | Status: DC | PRN

## 2021-10-23 MED ORDER — ONDANSETRON HCL 4 MG/2ML IJ SOLN: 4.0000 mg | INTRAMUSCULAR | Status: DC | PRN

## 2021-10-23 MED ORDER — TRANEXAMIC ACID-NACL 1000-0.7 MG/100ML-% IV SOLN
INTRAVENOUS | Status: AC
  Administered 2021-10-23: 1000 mg
  Filled 2021-10-23: qty 100

## 2021-10-23 MED ORDER — LACTATED RINGERS IV SOLN: 500.0000 mL | Freq: Once | INTRAVENOUS | Status: DC

## 2021-10-23 MED ORDER — DIBUCAINE (PERIANAL) 1 % EX OINT: 1.0000 "application " | TOPICAL_OINTMENT | CUTANEOUS | Status: DC | PRN

## 2021-10-23 MED ORDER — IBUPROFEN 600 MG PO TABS
600.0000 mg | ORAL_TABLET | Freq: Four times a day (QID) | ORAL | Status: DC
  Administered 2021-10-23 – 2021-10-25 (×6): 600 mg via ORAL
  Filled 2021-10-23 (×6): qty 1

## 2021-10-23 MED ORDER — INFLUENZA VAC SPLIT QUAD 0.5 ML IM SUSY
0.5000 mL | PREFILLED_SYRINGE | INTRAMUSCULAR | Status: AC
  Administered 2021-10-24: 0.5 mL via INTRAMUSCULAR
  Filled 2021-10-23: qty 0.5

## 2021-10-23 MED ORDER — TETANUS-DIPHTH-ACELL PERTUSSIS 5-2.5-18.5 LF-MCG/0.5 IM SUSY: 0.5000 mL | PREFILLED_SYRINGE | Freq: Once | INTRAMUSCULAR | Status: DC

## 2021-10-23 MED ORDER — PHENYLEPHRINE 40 MCG/ML (10ML) SYRINGE FOR IV PUSH (FOR BLOOD PRESSURE SUPPORT)
80.0000 ug | PREFILLED_SYRINGE | INTRAVENOUS | Status: DC | PRN
  Administered 2021-10-23: 80 ug via INTRAVENOUS

## 2021-10-23 MED ORDER — ONDANSETRON HCL 4 MG PO TABS: 4.0000 mg | ORAL_TABLET | ORAL | Status: DC | PRN

## 2021-10-23 MED ORDER — DIPHENHYDRAMINE HCL 50 MG/ML IJ SOLN: 12.5000 mg | INTRAMUSCULAR | Status: DC | PRN

## 2021-10-23 MED ORDER — FENTANYL CITRATE (PF) 100 MCG/2ML IJ SOLN
100.0000 ug | INTRAMUSCULAR | Status: DC | PRN
  Administered 2021-10-23 (×5): 100 ug via INTRAVENOUS
  Filled 2021-10-23 (×5): qty 2

## 2021-10-23 MED ORDER — SIMETHICONE 80 MG PO CHEW: 80.0000 mg | CHEWABLE_TABLET | ORAL | Status: DC | PRN

## 2021-10-23 MED ORDER — SENNOSIDES-DOCUSATE SODIUM 8.6-50 MG PO TABS
2.0000 | ORAL_TABLET | Freq: Every day | ORAL | Status: DC
  Administered 2021-10-24: 2 via ORAL
  Filled 2021-10-23: qty 2

## 2021-10-23 MED ORDER — LIDOCAINE HCL (PF) 1 % IJ SOLN
INTRAMUSCULAR | Status: DC | PRN
  Administered 2021-10-23: 10 mL via EPIDURAL

## 2021-10-23 MED ORDER — ACETAMINOPHEN 325 MG PO TABS: 650.0000 mg | ORAL_TABLET | ORAL | Status: DC | PRN

## 2021-10-23 NOTE — Anesthesia Procedure Notes (Signed)
Epidural Patient location during procedure: OB Start time: 10/23/2021 4:46 PM End time: 10/23/2021 4:50 PM  Staffing Anesthesiologist: Leilani Able, MD Performed: anesthesiologist   Preanesthetic Checklist Completed: patient identified, IV checked, site marked, risks and benefits discussed, surgical consent, monitors and equipment checked, pre-op evaluation and timeout performed  Epidural Patient position: sitting Prep: DuraPrep and site prepped and draped Patient monitoring: continuous pulse ox and blood pressure Approach: midline Location: L3-L4 Injection technique: LOR air  Needle:  Needle type: Tuohy  Needle gauge: 17 G Needle length: 9 cm and 9 Needle insertion depth: 5 cm cm Catheter type: closed end flexible Catheter size: 19 Gauge Catheter at skin depth: 10 cm Test dose: negative and Other  Assessment Events: blood not aspirated, injection not painful, no injection resistance, no paresthesia and negative IV test

## 2021-10-23 NOTE — Progress Notes (Signed)
Patient ID: Linda Wilkins, female   DOB: [birth year], 30 y.o.   MRN: [id number] Doing well, requesting Fentanyl  Vitals:   10/23/21 0200 10/23/21 0231 10/23/21 0300 10/23/21 0335  BP: (!) 110/94 (!) 96/50 (!) 80/43 (!) 90/56  Pulse: (!) 174 67 72 60  Resp: 16 16 16    Temp:      TempSrc:      Weight:      Height:       FHR reactive 130s with accels UCs more regular  Cervix being checked now   Will continue plan of care

## 2021-10-23 NOTE — Anesthesia Preprocedure Evaluation (Signed)
Anesthesia Evaluation  Patient identified by MRN, date of birth, ID band Patient awake    Reviewed: Allergy & Precautions, H&P , Patient's Chart, lab work & pertinent test results  Airway Mallampati: I  TM Distance: >3 FB Neck ROM: full    Dental no notable dental hx.    Pulmonary former smoker,    Pulmonary exam normal        Cardiovascular negative cardio ROS Normal cardiovascular exam     Neuro/Psych negative neurological ROS  negative psych ROS   GI/Hepatic negative GI ROS, Neg liver ROS,   Endo/Other  negative endocrine ROS  Renal/GU negative Renal ROS     Musculoskeletal   Abdominal Normal abdominal exam  (+)   Peds  Hematology negative hematology ROS (+)   Anesthesia Other Findings   Reproductive/Obstetrics (+) Pregnancy                             Anesthesia Physical Anesthesia Plan  ASA: 2  Anesthesia Plan: Epidural   Post-op Pain Management:    Induction:   PONV Risk Score and Plan:   Airway Management Planned:   Additional Equipment:   Intra-op Plan:   Post-operative Plan:   Informed Consent: I have reviewed the patients History and Physical, chart, labs and discussed the procedure including the risks, benefits and alternatives for the proposed anesthesia with the patient or authorized representative who has indicated his/her understanding and acceptance.       Plan Discussed with:   Anesthesia Plan Comments:         Anesthesia Quick Evaluation

## 2021-10-23 NOTE — Progress Notes (Signed)
Linda Wilkins is a 30 y.o. G2P1001 at [redacted]w[redacted]d admitted for elective IOL  Subjective: Patient comfortable with epidural.   Objective: BP 98/73   Pulse 65   Temp 98.3 F (36.8 C) (Oral)   Resp 20   Ht 5' (1.524 m)   Wt 67.4 kg   LMP 01/20/2021   SpO2 99%   BMI 29.00 kg/m  No intake/output data recorded. No intake/output data recorded.  FHT: 125 bpm, moderate variability, +15x15 accels, no decels UC: Q 2-3 mins SVE:   Dilation: 4 Effacement (%): 80 Station: -2 Exam by:: D.Kauan Kloosterman  Labs: Lab Results  Component Value Date   WBC 8.5 10/22/2021   HGB 13.2 10/22/2021   HCT 38.0 10/22/2021   MCV 91.1 10/22/2021   PLT 142 (L) 10/22/2021    Assessment / Plan: Linda Wilkins is a a 30 y.o. G2P1001 at [redacted]w[redacted]d admitted for elective IOL  Labor: Currently on Pitocin. Continue titration prn per unit policy. I discussed r/b/a to AROM. Patient desires AROM. Small amount of clear fluid. Both patient and fetus tolerated procedure well. Fetal Wellbeing:  Category I Pain Control:  epidural prn I/D:  GBS neg Anticipated MOD:  NSVD   Brand Males, CNM 10/23/2021, 6:38 PM

## 2021-10-23 NOTE — Progress Notes (Signed)
Linda Wilkins is a 30 y.o. G2P1001 at [redacted]w[redacted]d admitted for elective IOL  Subjective: Patient sleeping  Objective: BP (!) 91/54   Pulse 60   Temp 98.3 F (36.8 C) (Oral)   Resp 17   Ht 5' (1.524 m)   Wt 67.4 kg   LMP 01/20/2021   BMI 29.00 kg/m  No intake/output data recorded. No intake/output data recorded.  FHT: 125 bpm, moderate variability, +15x15 accels, no decels UC: Q 2-4 mins SVE:   Dilation: 3 Effacement (%): 60 Station: -1, -2 Exam by:: Ricardo Jericho, RN  Labs: Lab Results  Component Value Date   WBC 8.5 10/22/2021   HGB 13.2 10/22/2021   HCT 38.0 10/22/2021   MCV 91.1 10/22/2021   PLT 142 (L) 10/22/2021    Assessment / Plan: Linda Wilkins is a a 30 y.o. G2P1001 at [redacted]w[redacted]d admitted for elective IOL  Labor: Currently on Pitocin. Continue titration prn per unit policy. AROM when appropriate Fetal Wellbeing:  Category I Pain Control:  epidural prn I/D:  GBS neg Anticipated MOD:  NSVD   Brand Males, CNM 10/23/2021, 9:34 AM

## 2021-10-23 NOTE — Progress Notes (Signed)
Linda Wilkins is a 30 y.o. G2P1001 at [redacted]w[redacted]d admitted for elective IOL  Subjective: Patient doing well. Has no complaints or concerns at this time. Wants to discuss AROM  Objective: BP (!) 89/57   Pulse (!) 56   Temp 98.3 F (36.8 C) (Oral)   Resp 17   Ht 5' (1.524 m)   Wt 67.4 kg   LMP 01/20/2021   BMI 29.00 kg/m  No intake/output data recorded. No intake/output data recorded.  FHT: 125 bpm, moderate variability, +15x15 accels, no decels UC: Q 2-6 mins SVE:   Dilation: 3 Effacement (%): 60 Station: -2 Exam by:: Diannia Ruder, RN  Labs: Lab Results  Component Value Date   WBC 8.5 10/22/2021   HGB 13.2 10/22/2021   HCT 38.0 10/22/2021   MCV 91.1 10/22/2021   PLT 142 (L) 10/22/2021    Assessment / Plan: Linda Wilkins is a a 30 y.o. G2P1001 at [redacted]w[redacted]d admitted for elective IOL  Labor: Currently on Pitocin. Continue titration prn per unit policy. I discussed r/b/a to AROM. Patient desires AROM after she gets an epidural. Will proceed with AROM once patient comfortable with epidural Fetal Wellbeing:  Category I Pain Control:  epidural prn I/D:  GBS neg Anticipated MOD:  NSVD   Brand Males, CNM 10/23/2021, 1:20 PM

## 2021-10-23 NOTE — Discharge Summary (Signed)
Postpartum Discharge Summary     Patient Name: Linda Wilkins DOB: 04/29/1991 MRN: [id number]  Date of admission: 10/22/2021 Delivery date:10/23/2021  Delivering provider: Renee Harder  Date of discharge: 10/24/2021  Admitting diagnosis: Encounter for induction of labor [Z34.90] Intrauterine pregnancy: [redacted]w[redacted]d    Secondary diagnosis:  Principal Problem:   Encounter for induction of labor Active Problems:   Vacuum-assisted vaginal delivery  Additional problems: none    Discharge diagnosis: Term Pregnancy Delivered                                              Post partum procedures: none Augmentation: AROM and Pitocin Complications: None  Hospital course: Induction of Labor With Vaginal Delivery   30y.o. yo G2P1001 at 320w3das admitted to the hospital 10/22/2021 for induction of labor.  Indication for induction: Elective.  Patient had an uncomplicated labor course as follows: Membrane Rupture Time/Date: 5:40 PM ,10/23/2021   Delivery Method:Vaginal, Vacuum (Extractor)  Episiotomy: None  Lacerations:  None  Details of delivery can be found in separate delivery note.  Patient had a routine postpartum course. Patient is discharged home 10/24/21 per her request for early discharge as long as the baby can go as well.  Newborn Data: Birth date:10/23/2021  Birth time:7:02 PM  Gender:Female  Living status:Living  Apgars:8 ,9  Weight:3060 g (6lb 11.9oz)  Magnesium Sulfate received: No BMZ received: No Rhophylac:N/A MMR:N/A T-DaP:Given prenatally Flu: No Transfusion:No  Physical exam  Vitals:   10/23/21 2226 10/24/21 0214 10/24/21 0605 10/24/21 0908  BP: (!) 90/54 (!) 84/56 (!) 86/62 (!) 85/53  Pulse: 70 61 65 71  Resp: _0 Temp: 97.8 F (36.6 C) 98.3 F (36.8 C) 98 F (36.7 C) 97.6 F (36.4 C)  TempSrc: Oral Oral Oral Oral  SpO2: 97% 97% 97% 99%  Weight:      Height:       General: alert and cooperative Lochia: appropriate Uterine Fundus:  firm Incision: N/A DVT Evaluation: No evidence of DVT seen on physical exam. Labs: Lab Results  Component Value Date   WBC 8.5 10/22/2021   HGB 13.2 10/22/2021   HCT 38.0 10/22/2021   MCV 91.1 10/22/2021   PLT 142 (L) 10/22/2021   No flowsheet data found. Edinburgh Score: Edinburgh Postnatal Depression Scale Screening Tool 10/24/2021  I have been able to laugh and see the funny side of things. 0  I have looked forward with enjoyment to things. 0  I have blamed myself unnecessarily when things went wrong. 2  I have been anxious or worried for no good reason. 0  I have felt scared or panicky for no good reason. 0  Things have been getting on top of me. 0  I have been so unhappy that I have had difficulty sleeping. 1  I have felt sad or miserable. 1  I have been so unhappy that I have been crying. 1  The thought of harming myself has occurred to me. 0  Edinburgh Postnatal Depression Scale Total 5     After visit meds:  Allergies as of 10/24/2021       Reactions   Bactrim [sulfamethoxazole-trimethoprim]         Medication List     STOP taking these medications    cyclobenzaprine 10 MG tablet Commonly known as: FLEXERIL   famotidine  20 MG tablet Commonly known as: PEPCID   Paxlovid 20 x 150 MG & 10 x 100MG Tbpk Generic drug: nirmatrelvir & ritonavir       TAKE these medications    ibuprofen 600 MG tablet Commonly known as: ADVIL Take 1 tablet (600 mg total) by mouth every 6 (six) hours as needed.   PRENATAL GUMMIES PO Take 2 tablets by mouth daily.         Discharge home in stable condition Infant Feeding: Bottle Infant Disposition:home with mother Discharge instruction: per After Visit Summary and Postpartum booklet. Activity: Advance as tolerated. Pelvic rest for 6 weeks.  Diet: routine diet Future Appointments: Future Appointments  Date Time Provider [place]  11/26/2021 10:35 AM Truett Mainland, DO [facility] None   Follow up  Visit:   Please schedule this patient for a In person postpartum visit in 6 weeks with the following provider: Any provider. Additional Postpartum F/U: none   Low risk pregnancy complicated by:  none Delivery mode:  Vaginal, Vacuum (Extractor)  Anticipated Birth Control:   Paragard outpatient   10/24/2021 Myrtis Ser, CNM 10:55 AM

## 2021-10-24 MED ORDER — IBUPROFEN 600 MG PO TABS: 600.0000 mg | ORAL_TABLET | Freq: Four times a day (QID) | ORAL | 0 refills | Status: DC | PRN

## 2021-10-24 NOTE — Anesthesia Postprocedure Evaluation (Signed)
Anesthesia Post Note  Patient: Linda Wilkins  Procedure(s) Performed: AN AD HOC LABOR EPIDURAL     Patient location during evaluation: Mother Baby Anesthesia Type: Epidural Level of consciousness: awake and alert and oriented Pain management: satisfactory to patient Vital Signs Assessment: post-procedure vital signs reviewed and stable Respiratory status: respiratory function stable Cardiovascular status: stable Postop Assessment: no headache, no backache, epidural receding, patient able to bend at knees, no signs of nausea or vomiting, adequate PO intake and able to ambulate Anesthetic complications: no   No notable events documented.  Last Vitals:  Vitals:   10/24/21 0605 10/24/21 0908  BP: (!) 86/62 (!) 85/53  Pulse: 65 71  Resp: 17 18  Temp: 36.7 C 36.4 C  SpO2: 97% 99%    Last Pain:  Vitals:   10/24/21 0908  TempSrc: Oral  PainSc:    Pain Goal:                   Alise Calais

## 2021-10-24 NOTE — Progress Notes (Signed)
POSTPARTUM PROGRESS NOTE  Post Partum Day 1  Subjective:  Linda Wilkins is a 30 y.o. G2P2002 s/p VAVD at [redacted]w[redacted]d.  She reports she is doing well. No acute events overnight. She denies any problems with ambulating, voiding or po intake. Has not moved her bowels but has passed gas. Denies light headedness, dizziness, nausea or vomiting.  Pain is well controlled. Lochia is normal and on par with regular menses.  Objective: Blood pressure (!) 86/62, pulse 65, temperature 98 F (36.7 C), temperature source Oral, resp. rate 17, height 5' (1.524 m), weight 67.4 kg, last menstrual period 01/20/2021, SpO2 97 %, unknown if currently breastfeeding.  Physical Exam:  General: alert, cooperative and no distress Chest: no respiratory distress Heart:regular rate, distal pulses intact Abdomen: soft, nontender,  Uterine Fundus: firm, appropriately tender DVT Evaluation: No calf swelling or tenderness Extremities: No lower extremity edema Skin: warm, dry  Recent Labs    10/22/21 2356  HGB 13.2  HCT 38.0    Assessment/Plan: Linda Wilkins is a 30 y.o. G2P2002 s/p VAVD at [redacted]w[redacted]d   PPD#1 - Doing well  Routine postpartum care  Contraception: Would like to have Paragard placed before discharge Feeding: Formula Dispo: Plan for discharge later today or early tomorrow.   LOS: 2 days   Linda Wilkins, MS3  10/24/2021, 9:00 AM

## 2021-10-25 NOTE — Discharge Summary (Signed)
Postpartum Discharge Summary     Patient Name: Linda Wilkins DOB: [birth year] MRN: [id number]  Date of admission: 10/22/2021 Delivery date:10/23/2021  Delivering provider: Renee Harder  Date of discharge: 10/25/2021  Admitting diagnosis: Encounter for induction of labor [Z34.90] Intrauterine pregnancy: [redacted]w[redacted]d     Secondary diagnosis:  Principal Problem:   Encounter for induction of labor Active Problems:   Vacuum-assisted vaginal delivery  Additional problems: none    Discharge diagnosis: Term Pregnancy Delivered                                              Post partum procedures: none Augmentation: AROM and Pitocin Complications: None  Hospital course: Induction of Labor With Vaginal Delivery   30 y.o. yo G2P1001 at [redacted]w[redacted]d was admitted to the hospital 10/22/2021 for induction of labor.  Indication for induction: Elective.  Patient had an uncomplicated labor course as follows: Membrane Rupture Time/Date: 5:40 PM ,10/23/2021   Delivery Method:Vaginal, Vacuum (Extractor)  Episiotomy: None  Lacerations:  None  Details of delivery can be found in separate delivery note.  Patient had a routine postpartum course. Patient is discharged home 10/25/21 (had desired d/c 12/2 but the baby needed to stay).  Newborn Data: Birth date:10/23/2021  Birth time:7:02 PM  Gender:Female  Living status:Living  Apgars:8 ,9  Weight:3060 g (6lb 11.9oz)  Magnesium Sulfate received: No BMZ received: No Rhophylac:N/A MMR:N/A T-DaP:Given prenatally Flu: No Transfusion:No  Physical exam  Vitals:   10/24/21 0605 10/24/21 0908 10/24/21 1441 10/24/21 2116  BP: (!) 86/62 (!) 85/53 (!) 89/53 (!) 94/52  Pulse: 65 71 72 63  Resp: $Remo'17 18 17 18  'MXpSo$ Temp: 98 F (36.7 C) 97.6 F (36.4 C) 97.8 F (36.6 C) 97.8 F (36.6 C)  TempSrc: Oral Oral Oral Oral  SpO2: 97% 99% 98%   Weight:      Height:       General: alert and cooperative Lochia: appropriate Uterine Fundus: firm Incision: N/A DVT  Evaluation: No evidence of DVT seen on physical exam. Labs: Lab Results  Component Value Date   WBC 8.5 10/22/2021   HGB 13.2 10/22/2021   HCT 38.0 10/22/2021   MCV 91.1 10/22/2021   PLT 142 (L) 10/22/2021   No flowsheet data found. Edinburgh Score: Edinburgh Postnatal Depression Scale Screening Tool 10/24/2021  I have been able to laugh and see the funny side of things. 0  I have looked forward with enjoyment to things. 0  I have blamed myself unnecessarily when things went wrong. 2  I have been anxious or worried for no good reason. 0  I have felt scared or panicky for no good reason. 0  Things have been getting on top of me. 0  I have been so unhappy that I have had difficulty sleeping. 1  I have felt sad or miserable. 1  I have been so unhappy that I have been crying. 1  The thought of harming myself has occurred to me. 0  Edinburgh Postnatal Depression Scale Total 5     After visit meds:  Allergies as of 10/25/2021       Reactions   Bactrim [sulfamethoxazole-trimethoprim]         Medication List     STOP taking these medications    cyclobenzaprine 10 MG tablet Commonly known as: FLEXERIL   famotidine 20 MG tablet Commonly known  as: PEPCID   Paxlovid 20 x 150 MG & 10 x $Re'100MG'WIG$  Tbpk Generic drug: nirmatrelvir & ritonavir       TAKE these medications    ibuprofen 600 MG tablet Commonly known as: ADVIL Take 1 tablet (600 mg total) by mouth every 6 (six) hours as needed.   PRENATAL GUMMIES PO Take 2 tablets by mouth daily.         Discharge home in stable condition Infant Feeding: Bottle Infant Disposition:home with mother Discharge instruction: per After Visit Summary and Postpartum booklet. Activity: Advance as tolerated. Pelvic rest for 6 weeks.  Diet: routine diet Future Appointments: Future Appointments  Date Time Provider [place]  11/26/2021 10:35 AM Truett Mainland, DO [facility] None   Follow up Visit:   Please schedule this  patient for a In person postpartum visit in 6 weeks with the following provider: Any provider. Additional Postpartum F/U: none   Low risk pregnancy complicated by:  none Delivery mode:  Vaginal, Vacuum (Extractor)  Anticipated Birth Control:   Paragard outpatient   10/25/2021 Myrtis Ser, CNM 9:32 AM

## 2021-10-29 ENCOUNTER — Inpatient Hospital Stay (HOSPITAL_COMMUNITY): Admit: 2021-10-29 | Payer: Self-pay

## 2021-11-06 ENCOUNTER — Telehealth (HOSPITAL_COMMUNITY): Payer: Self-pay | Admitting: *Deleted

## 2021-11-06 NOTE — Telephone Encounter (Signed)
Mom reports feeling good. No concerns about herself at this time. EPDS=2(Hospital score=5) Mom reports baby is doing well. Feeding, peeing without difficulty, pooping soft green, but seems fussy when pooping. Peds appt tomorrow. Safe sleep reviewed. Mom reports no concerns about baby at present.  Duffy Rhody, RN 11-06-2021 at 3:50pm

## 2021-11-26 ENCOUNTER — Ambulatory Visit (INDEPENDENT_AMBULATORY_CARE_PROVIDER_SITE_OTHER): Payer: Medicaid Other | Admitting: Family Medicine

## 2021-11-26 ENCOUNTER — Other Ambulatory Visit: Payer: Self-pay

## 2021-11-26 DIAGNOSIS — Z30430 Encounter for insertion of intrauterine contraceptive device: Secondary | ICD-10-CM | POA: Diagnosis not present

## 2021-11-26 MED ORDER — PARAGARD INTRAUTERINE COPPER IU IUD
1.0000 | INTRAUTERINE_SYSTEM | Freq: Once | INTRAUTERINE | Status: AC
Start: 2021-11-26 — End: 2021-11-26
  Administered 2021-11-26: 1 via INTRAUTERINE

## 2021-11-26 NOTE — Progress Notes (Signed)
IUD Procedure Note Patient identified, informed consent performed, signed copy in chart, time out was performed.  Urine pregnancy test negative.  Speculum placed in the vagina.  Cervix visualized.  Cleaned with Betadine x 2.  Paracervical block placed with Lidocaine 2% with epinephrine 39mL injected at the 12 o'clock positions. Cervix grasped anteriorly with a single tooth tenaculum.  Uterus sounded to 8 cm.  Paragard  IUD placed per manufacturer's recommendations.  Strings trimmed to 3 cm. Tenaculum was removed, good hemostasis noted.  Patient tolerated procedure well.   Patient given post procedure instructions and Paragard care card with expiration date.  Patient is asked to check IUD strings periodically and follow up in 4-6 weeks for IUD check.

## 2021-11-26 NOTE — Progress Notes (Signed)
Post Partum Visit Note  Linda Wilkins is a 31 y.o. [id number] female who presents for a postpartum visit. She is 4 weeks postpartum following a n vacuum assisted vaginal delivery.  I have fully reviewed the prenatal and intrapartum course. The delivery was at 39.3 gestational weeks.  Anesthesia: epidural. Postpartum course has been uneventful. Baby is doing well. Baby is feeding by bottle - Enfamil AR. Bleeding no bleeding. Bowel function is normal. Bladder function is normal. Patient is not sexually active. Contraception method is IUD- paraguard. Postpartum depression screening: negative.   The pregnancy intention screening data noted above was reviewed. Potential methods of contraception were discussed. The patient elected to proceed with No data recorded.   Edinburgh Postnatal Depression Scale - 11/26/21 1042       Edinburgh Postnatal Depression Scale:  In the Past 7 Days   I have been able to laugh and see the funny side of things. 0    I have looked forward with enjoyment to things. 0    I have blamed myself unnecessarily when things went wrong. 1    I have been anxious or worried for no good reason. 1    I have felt scared or panicky for no good reason. 0    Things have been getting on top of me. 0    I have been so unhappy that I have had difficulty sleeping. 0    I have felt sad or miserable. 1    I have been so unhappy that I have been crying. 0    The thought of harming myself has occurred to me. 0    Edinburgh Postnatal Depression Scale Total 3             Health Maintenance Due  Topic Date Due   COVID-19 Vaccine (1) Never done    The following portions of the patient's history were reviewed and updated as appropriate: allergies, current medications, past family history, past medical history, past social history, past surgical history, and problem list.  Review of Systems Pertinent items are noted in HPI.  Objective:  BP (!) 93/57    Pulse 72    Wt 135 lb (61.2  kg)    Breastfeeding No    BMI 26.37 kg/m    General:  alert, cooperative, and no distress   Breasts:  not indicated  Lungs: clear to auscultation bilaterally  Heart:  regular rate and rhythm, S1, S2 normal, no murmur, click, rub or gallop  Abdomen: soft, non-tender; bowel sounds normal; no masses,  no organomegaly   Wound N/a  GU exam:  normal       Assessment:   1. Postpartum exam      Plan:   Essential components of care per ACOG recommendations:  1.  Mood and well being: Patient with negative depression screening today. Reviewed local resources for support.  - Patient tobacco use? No.   - hx of drug use? No.    2. Infant care and feeding:  -Patient currently breastmilk feeding? No.  -Social determinants of health (SDOH) reviewed in EPIC. No concerns  3. Sexuality, contraception and birth spacing - Patient does not want a pregnancy in the next year.  - Reviewed forms of contraception in tiered fashion. Patient desired IUD today.   - Discussed birth spacing of 18 months  4. Sleep and fatigue -Encouraged family/partner/community support of 4 hrs of uninterrupted sleep to help with mood and fatigue  5. Physical Recovery  - Discussed patients  delivery and complications. She describes her labor as good. - Patient had a Vaginal, no problems at delivery. Patient had   no  laceration.  - Patient has urinary incontinence? No. - Patient is safe to resume physical and sexual activity  6.  Health Maintenance - HM due items addressed Yes - Last pap smear  Diagnosis  Date Value Ref Range Status  04/02/2021   Final   - Negative for intraepithelial lesion or malignancy (NILM)   Pap smear not done at today's visit.  -Breast Cancer screening indicated? No.   7. Chronic Disease/Pregnancy Condition follow up: None  - PCP follow up  Levie Heritage, DO [facility], [facility]

## 2021-12-25 ENCOUNTER — Ambulatory Visit: Payer: Medicaid Other | Admitting: Family Medicine
